# Patient Record
Sex: Male | Born: 1990 | Race: White | Hispanic: No | Marital: Single | Smoking: Current every day smoker
Health system: Southern US, Community
[De-identification: ages and names within clinical notes are randomized; demographics above are authoritative.]

## PROBLEM LIST (undated history)

## (undated) DIAGNOSIS — F1910 Other psychoactive substance abuse, uncomplicated: Secondary | ICD-10-CM

## (undated) HISTORY — PX: FRACTURE SURGERY: SHX138

---
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
## 2009-04-10 ENCOUNTER — Emergency Department: Payer: Self-pay | Admitting: Emergency Medicine

## 2009-04-20 ENCOUNTER — Emergency Department: Payer: Self-pay | Admitting: Emergency Medicine

## 2009-05-04 ENCOUNTER — Ambulatory Visit: Payer: Self-pay | Admitting: Family Medicine

## 2009-06-22 ENCOUNTER — Emergency Department: Payer: Self-pay | Admitting: Emergency Medicine

## 2009-09-16 ENCOUNTER — Emergency Department: Payer: Self-pay | Admitting: Emergency Medicine

## 2009-10-13 ENCOUNTER — Emergency Department: Payer: Self-pay | Admitting: Emergency Medicine

## 2009-10-25 ENCOUNTER — Emergency Department: Payer: Self-pay | Admitting: Emergency Medicine

## 2009-11-01 ENCOUNTER — Emergency Department: Payer: Self-pay | Admitting: Emergency Medicine

## 2009-12-11 ENCOUNTER — Ambulatory Visit: Payer: Self-pay | Admitting: Family Medicine

## 2010-04-12 ENCOUNTER — Emergency Department: Payer: Self-pay | Admitting: Emergency Medicine

## 2010-08-19 ENCOUNTER — Emergency Department: Payer: Self-pay | Admitting: Emergency Medicine

## 2010-09-06 ENCOUNTER — Emergency Department: Payer: Self-pay | Admitting: Emergency Medicine

## 2010-10-17 ENCOUNTER — Emergency Department: Payer: Self-pay | Admitting: Internal Medicine

## 2010-11-15 ENCOUNTER — Emergency Department: Payer: Self-pay | Admitting: Emergency Medicine

## 2010-11-24 ENCOUNTER — Emergency Department: Payer: Self-pay | Admitting: Emergency Medicine

## 2011-06-08 ENCOUNTER — Emergency Department: Payer: Self-pay | Admitting: *Deleted

## 2012-12-17 IMAGING — CR RIGHT TIBIA AND FIBULA - 2 VIEW
1 series · 2 of 2 positions shown · non-contrast
Comparison: none

REASON FOR EXAM: leg pain
COMMENTS:   May transport without cardiac monitor

RESULT:     Comparison is made to the previous examination dated 12/11/2009.
An intramedullary rod is present in the tibia with a single screw traversing
the distal tibia and the rod. Healed fractures are present in the midshaft
regions of the tibia and fibula.

[Series 1: view not recorded · 0.17mm/px · 2 of 2 slices shown]
[im 1/2]
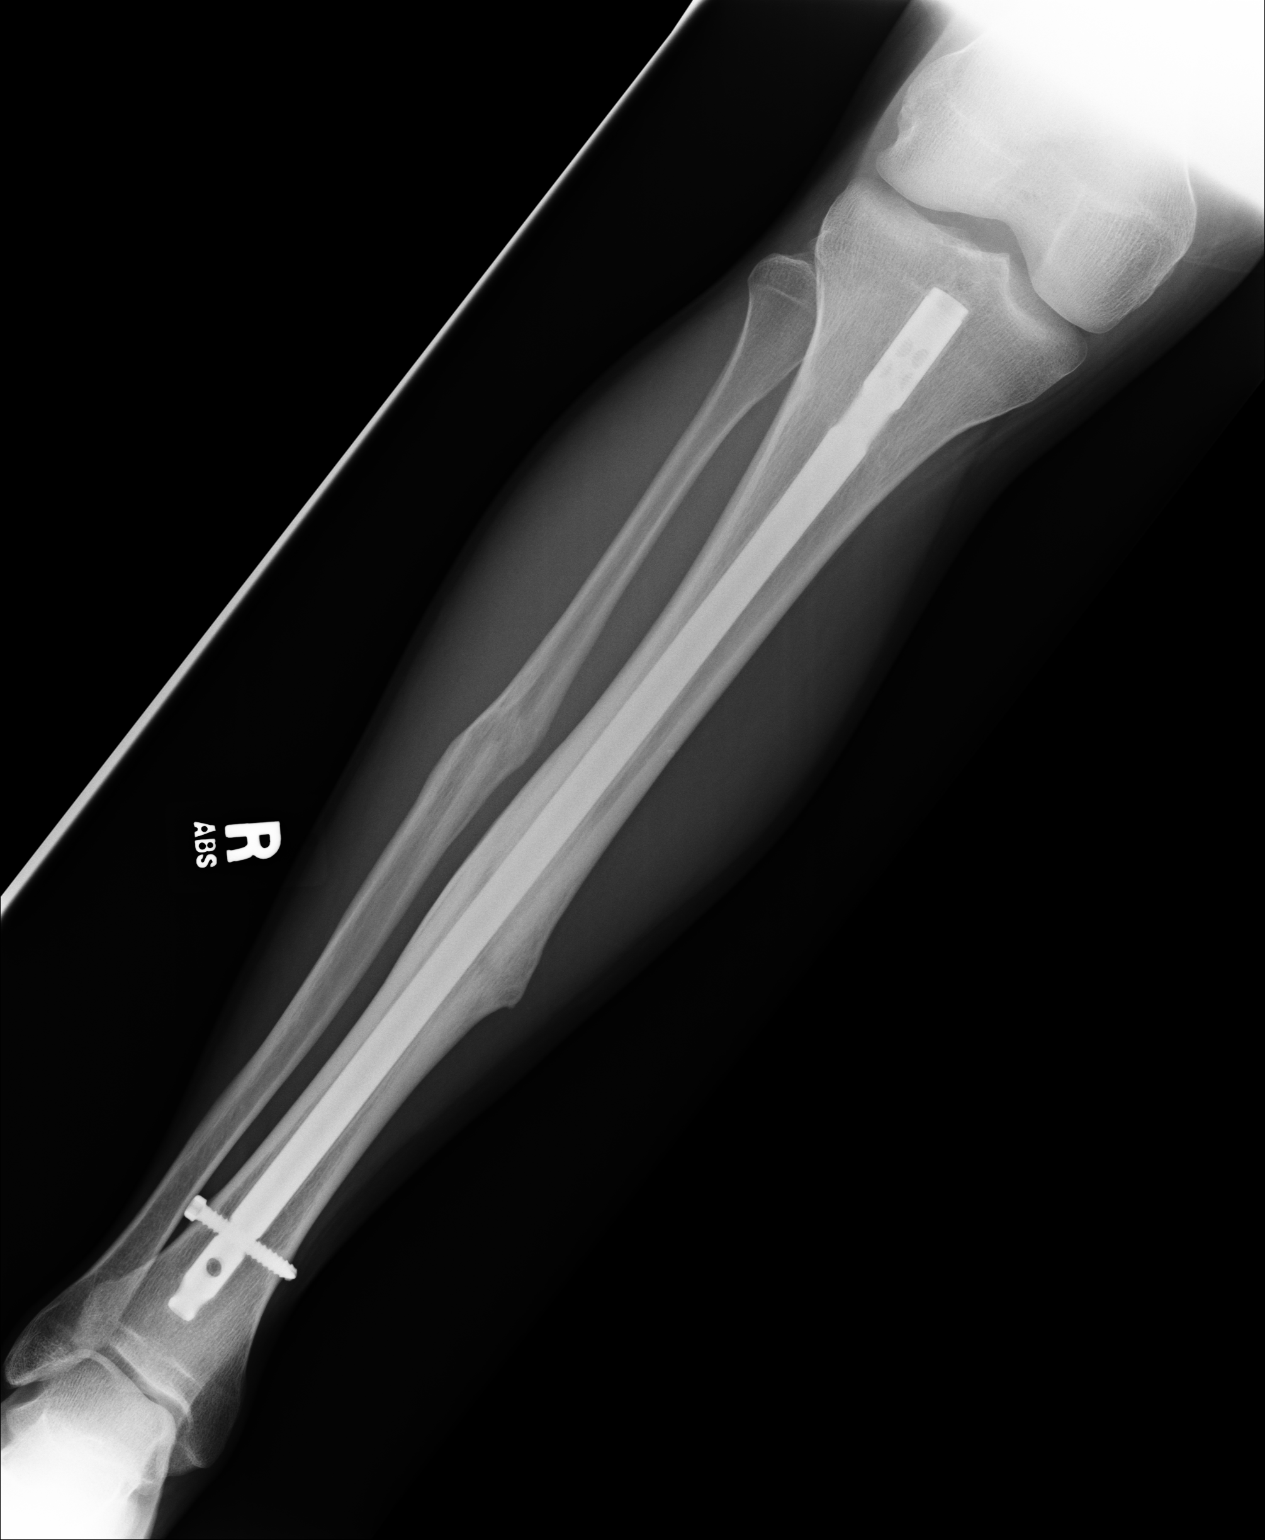
[im 2/2]
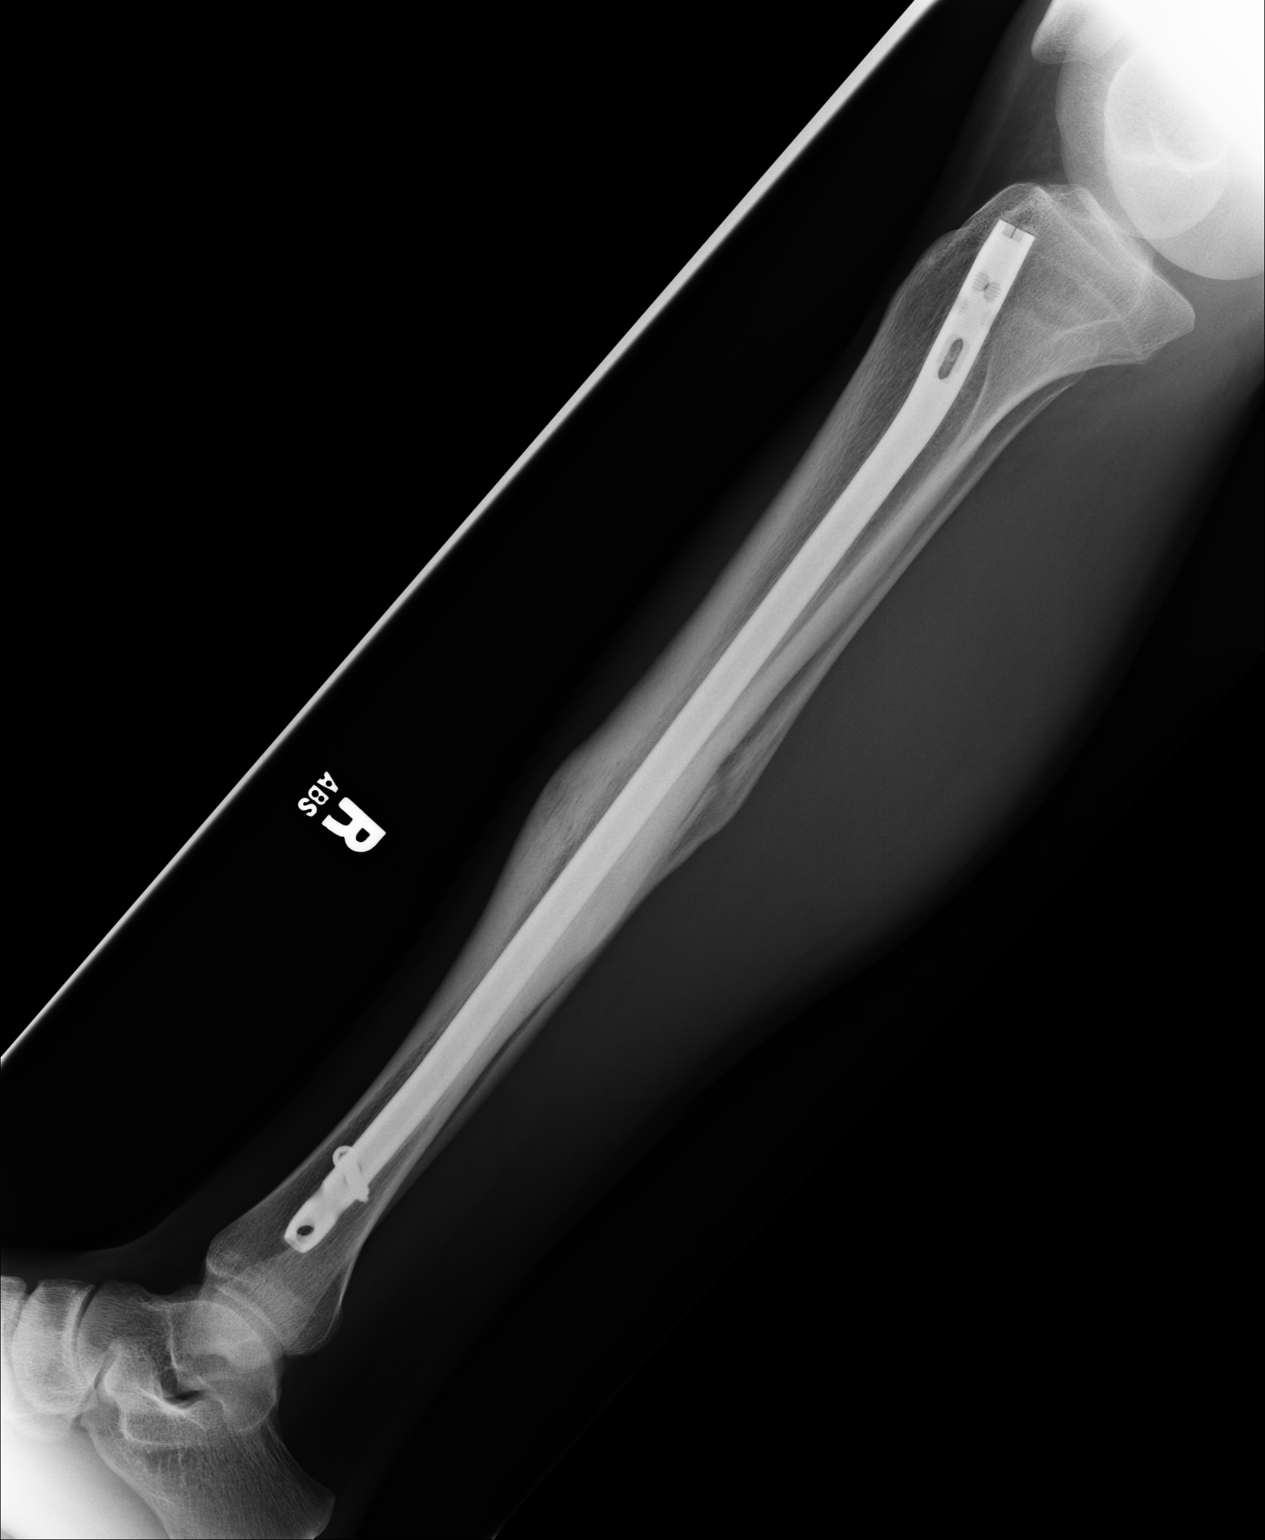

[2 of 2 positions shown; findings below may reference images not displayed]

IMPRESSION: Please see above.

## 2013-01-27 IMAGING — CR DG CHEST 2V
1 series · 2 of 2 positions shown · non-contrast
Comparison: none

REASON FOR EXAM: rib pain
COMMENTS:

[Series 1: view not recorded · 0.17mm/px · 2 of 2 slices shown]
[im 1/2]
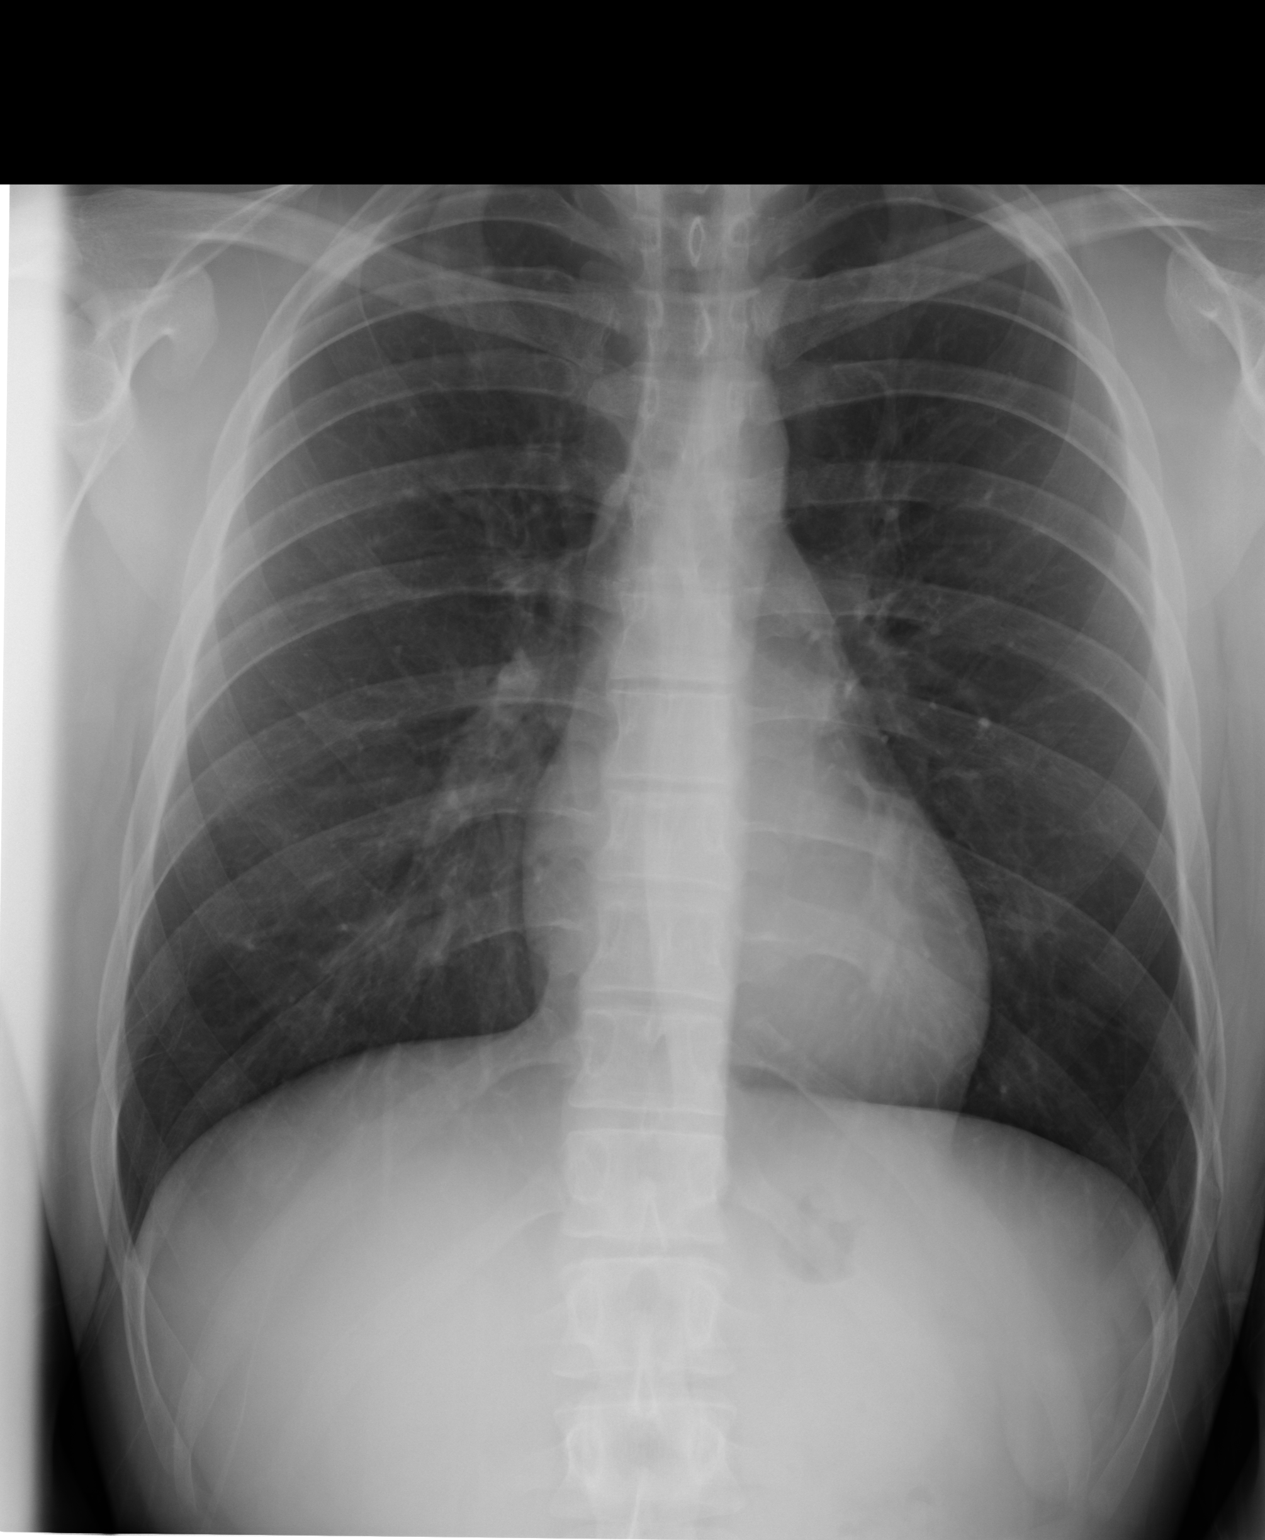
[im 2/2]
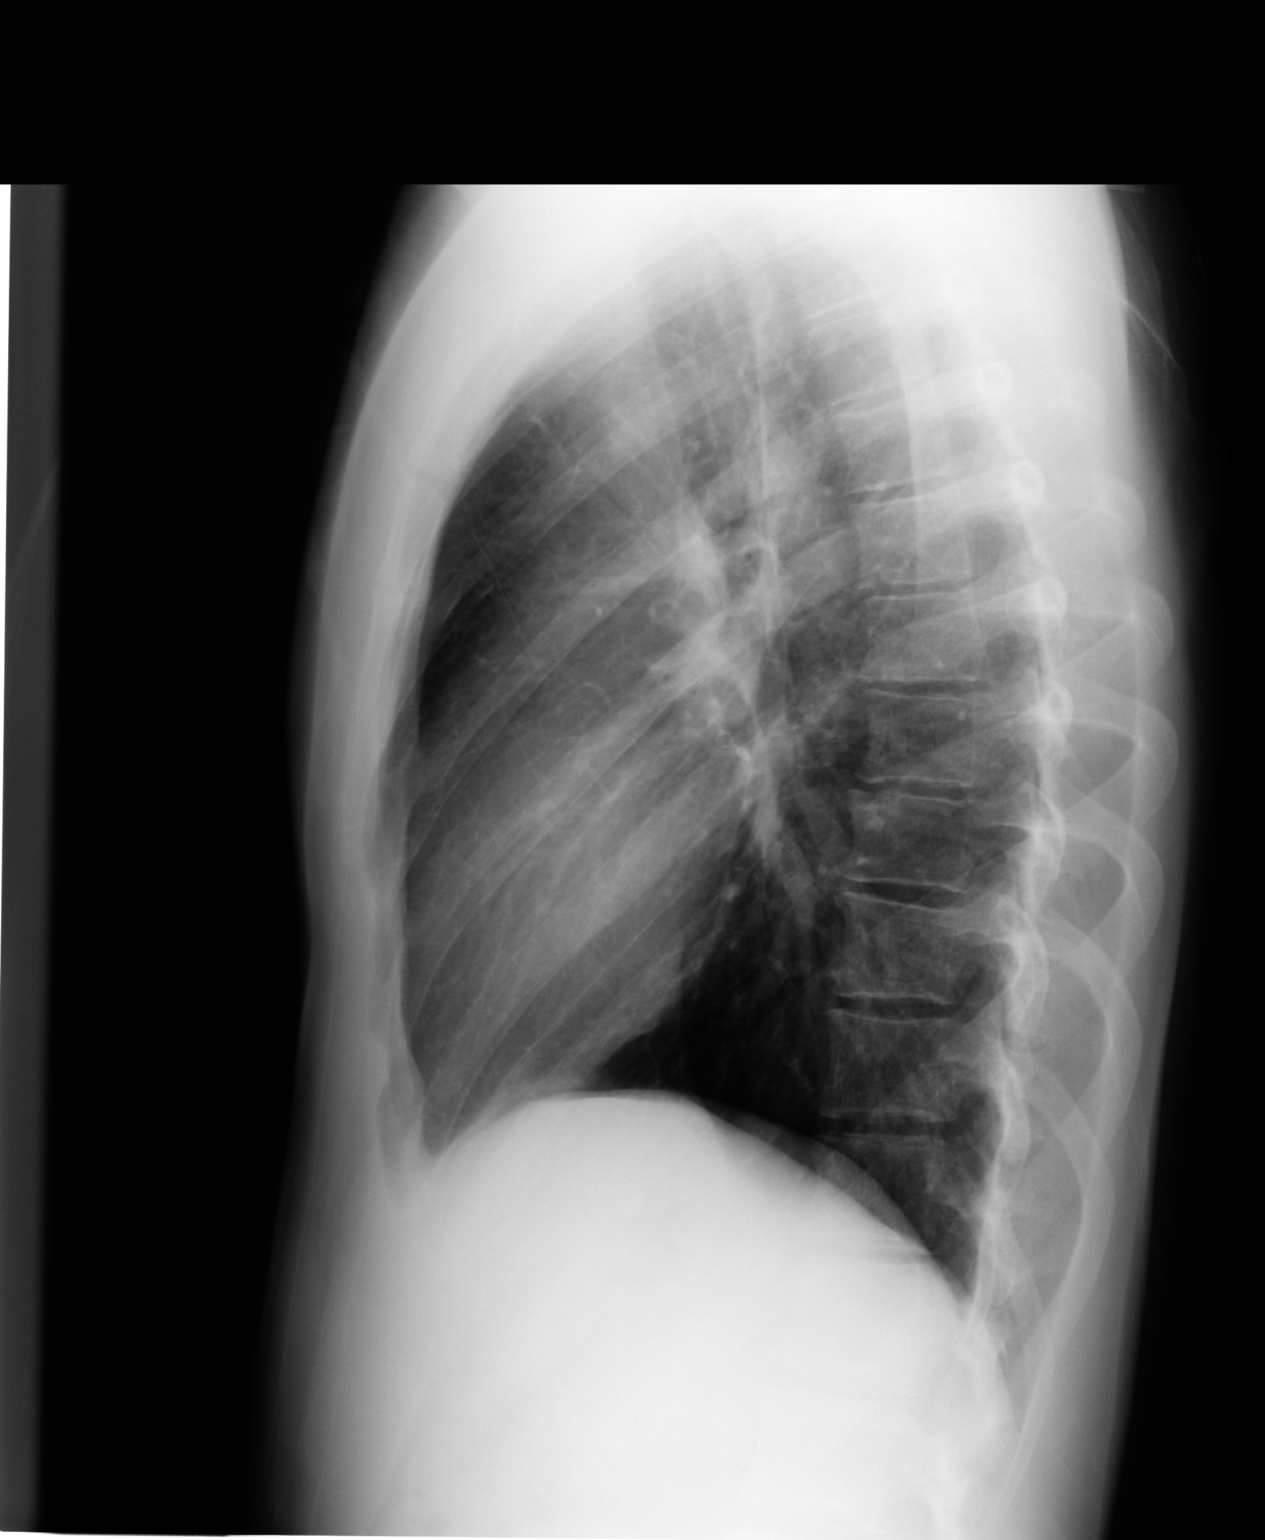

[2 of 2 positions shown; findings below may reference images not displayed]

PROCEDURE:     DXR - DXR CHEST PA (OR AP) AND LATERAL  - October 17, 2010  [DATE]

RESULT:     Comparison is made to the study of 04/12/2010.

The lungs are clear. The heart and pulmonary vessels are normal. The bony
and mediastinal structures are unremarkable. There is no effusion. There is
no pneumothorax or evidence of congestive failure.
IMPRESSION: No acute cardiopulmonary disease.

## 2014-08-07 ENCOUNTER — Ambulatory Visit: Payer: Self-pay | Admitting: Internal Medicine

## 2014-08-29 ENCOUNTER — Emergency Department: Payer: Self-pay | Admitting: Emergency Medicine

## 2018-10-02 ENCOUNTER — Emergency Department
Admission: EM | Admit: 2018-10-02 | Discharge: 2018-10-02 | Disposition: A | Discharge status: Home / Self Care | Payer: Self-pay | Attending: Emergency Medicine | Admitting: Emergency Medicine

## 2018-10-02 ENCOUNTER — Emergency Department: Payer: Self-pay

## 2018-10-02 ENCOUNTER — Encounter: Payer: Self-pay | Admitting: Emergency Medicine

## 2018-10-02 ENCOUNTER — Other Ambulatory Visit: Payer: Self-pay

## 2018-10-02 DIAGNOSIS — M0230 Reiter's disease, unspecified site: Secondary | ICD-10-CM | POA: Insufficient documentation

## 2018-10-02 DIAGNOSIS — F17210 Nicotine dependence, cigarettes, uncomplicated: Secondary | ICD-10-CM | POA: Insufficient documentation

## 2018-10-02 LAB — CBC WITH DIFFERENTIAL/PLATELET
Abs Immature Granulocytes: 0.01 10*3/uL (ref 0.00–0.07)
Basophils Absolute: 0 10*3/uL (ref 0.0–0.1)
Basophils Relative: 0 %
EOS ABS: 0.4 10*3/uL (ref 0.0–0.5)
EOS PCT: 8 %
HCT: 41.5 % (ref 39.0–52.0)
HEMOGLOBIN: 14.2 g/dL (ref 13.0–17.0)
IMMATURE GRANULOCYTES: 0 %
LYMPHS PCT: 37 %
Lymphs Abs: 2 10*3/uL (ref 0.7–4.0)
MCH: 30.9 pg (ref 26.0–34.0)
MCHC: 34.2 g/dL (ref 30.0–36.0)
MCV: 90.2 fL (ref 80.0–100.0)
Monocytes Absolute: 0.5 10*3/uL (ref 0.1–1.0)
Monocytes Relative: 10 %
NEUTROS PCT: 45 %
Neutro Abs: 2.4 10*3/uL (ref 1.7–7.7)
Platelets: 359 10*3/uL (ref 150–400)
RBC: 4.6 MIL/uL (ref 4.22–5.81)
RDW: 11.9 % (ref 11.5–15.5)
WBC: 5.4 10*3/uL (ref 4.0–10.5)
nRBC: 0 % (ref 0.0–0.2)

## 2018-10-02 LAB — COMPREHENSIVE METABOLIC PANEL
ALT: 64 U/L — AB (ref 0–44)
ANION GAP: 6 (ref 5–15)
AST: 52 U/L — ABNORMAL HIGH (ref 15–41)
Albumin: 3.9 g/dL (ref 3.5–5.0)
Alkaline Phosphatase: 54 U/L (ref 38–126)
BUN: 18 mg/dL (ref 6–20)
CHLORIDE: 105 mmol/L (ref 98–111)
CO2: 30 mmol/L (ref 22–32)
CREATININE: 0.75 mg/dL (ref 0.61–1.24)
Calcium: 8.3 mg/dL — ABNORMAL LOW (ref 8.9–10.3)
Glucose, Bld: 141 mg/dL — ABNORMAL HIGH (ref 70–99)
POTASSIUM: 4.3 mmol/L (ref 3.5–5.1)
SODIUM: 141 mmol/L (ref 135–145)
Total Bilirubin: 1.1 mg/dL (ref 0.3–1.2)
Total Protein: 6.3 g/dL — ABNORMAL LOW (ref 6.5–8.1)

## 2018-10-02 LAB — SEDIMENTATION RATE: SED RATE: 3 mm/h (ref 0–15)

## 2018-10-02 MED ORDER — PREDNISONE 20 MG PO TABS: 60.0000 mg | ORAL_TABLET | Freq: Every day | ORAL | 0 refills | Status: DC

## 2018-10-02 NOTE — ED Notes (Signed)
Pt alert and oriented X4, active, cooperative, pt in NAD. RR even and unlabored, color WNL.  Pt informed to return if any life threatening symptoms occur.  Discharge and followup instructions reviewed. Ambulates safely. 

## 2018-10-02 NOTE — ED Provider Notes (Addendum)
[facility] Emergency Department Provider Note  ____________________________________________   I have reviewed the triage vital signs and the nursing notes. Where available I have reviewed prior notes and, if possible and indicated, outside hospital notes.    HISTORY  Chief Complaint hand/foot swelling and Insect Bite    HPI Eric Clarke is a 28 y.o. male is healthy, he states since at least October has been having swelling to bilateral hands and bilateral feet, it is worse in the morning, gradually seems to get better during the course of the day.  No trauma.  No numbness no weakness.  He has had no fevers no chills.  He has no sense of fullness to his head, no history of trauma, he states that he sometimes tries nonsteroidal pain medications with limited success.  He denies any history of IV drug abuse, no recreational drugs, he does drink beer but not very often, he states a few beers on the weekend.  He denies any chest pain or shortness of breath.  He has decided to finally seek care because it is getting worse and it is involving his feet as well.  His feet have been involved for about a month.  He has no other symptoms he has no chest pain or shortness of breath.  His mother does suffer from lupus.  Also noticing a small area about 4 mm on his medial left thigh for couple days it seems to be getting better.  No other rash.  Tick bite, no IVDA history.   History reviewed. No pertinent past medical history.  There are no active problems to display for this patient.   History reviewed. No pertinent surgical history.  Prior to Admission medications   Not on File    Allergies Doxycycline  History reviewed. No pertinent family history.  Social History Social History   Tobacco Use  . Smoking status: Current Every Day Smoker    Packs/day: 0.25    Types: Cigarettes  . Smokeless tobacco: Current User    Types: Chew  Substance Use Topics  .  Alcohol use: Never    Frequency: Never  . Drug use: Never    Review of Systems Constitutional: No fever/chills Eyes: No visual changes. ENT: No sore throat. No stiff neck no neck pain Cardiovascular: Denies chest pain. Respiratory: Denies shortness of breath. Gastrointestinal:   no vomiting.  No diarrhea.  No constipation. Genitourinary: Negative for dysuria. Musculoskeletal: The HPI regarding r extremity swelling Skin: Negative for rash. Neurological: Negative for severe headaches, focal weakness or numbness.   ____________________________________________   PHYSICAL EXAM:  VITAL SIGNS: ED Triage Vitals  Enc Vitals Group     BP 10/02/18 1105 (!) 143/75     Pulse Rate 10/02/18 1105 72     Resp --      Temp 10/02/18 1105 98 F (36.7 C)     Temp Source 10/02/18 1105 Oral     SpO2 10/02/18 1105 99 %     Weight 10/02/18 1105 160 lb (72.6 kg)     Height 10/02/18 1105  (1.702 m)     Head Circumference --      Peak Flow --      Pain Score 10/02/18 1118 6     Pain Loc --      Pain Edu? --      Excl. in [place]? --     Constitutional: Alert and oriented. Well appearing and in no acute distress. Eyes: Conjunctivae are normal Head:  Atraumatic HEENT: No congestion/rhinnorhea. Mucous membranes are moist.  Oropharynx non-erythematous Neck:   Nontender with no meningismus, no masses, no stridor Cardiovascular: Normal rate, regular rhythm. Grossly normal heart sounds.  Good peripheral circulation. Respiratory: Normal respiratory effort.  No retractions. Lungs CTAB. Abdominal: Soft and nontender. No distention. No guarding no rebound Back:  There is no focal tenderness or step off.  there is no midline tenderness there are no lesions noted. there is no CVA tenderness Musculoskeletal: No lower extremity tenderness, no upper extremity tenderness.  Lateral hands do appear to be somewhat swollen, symmetrically so, very strong pulses compartments are soft, sensation and neurologic  function appears to be intact, strength intact, there is no swelling noted to the feet at this time.  No DVT signs strong distal pulses no edema Neurologic:  Normal speech and language. No gross focal neurologic deficits are appreciated.  Skin:  Skin is warm, dry and intact.  Is a small area to the left medial thigh, approximately 3 mm, indurated, not fluctuant, there is a small scab on top it looks like a resolving folliculitis with no surrounding cellulitis or foreign body noted. Psychiatric: Mood and affect are normal. Speech and behavior are normal.  ____________________________________________   LABS (all labs ordered are listed, but only abnormal results are displayed)  Labs Reviewed  COMPREHENSIVE METABOLIC PANEL - Abnormal; Notable for the following components:      Result Value   Glucose, Bld 141 (*)    Calcium 8.3 (*)    Total Protein 6.3 (*)    AST 52 (*)    ALT 64 (*)    All other components within normal limits  CBC WITH DIFFERENTIAL/PLATELET  SEDIMENTATION RATE  RHEUMATOID FACTOR  ANTINUCLEAR ANTIBODIES, IFA    Pertinent labs  results that were available during my care of the patient were reviewed by me and considered in my medical decision making (see chart for details). ____________________________________________  EKG  I personally interpreted any EKGs ordered by me or triage  ____________________________________________  RADIOLOGY  Pertinent labs & imaging results that were available during my care of the patient were reviewed by me and considered in my medical decision making (see chart for details). If possible, patient and/or family made aware of any abnormal findings.  No results found. ____________________________________________    PROCEDURES  Procedure(s) performed: None  Procedures  Critical Care performed: None  ____________________________________________   INITIAL IMPRESSION / ASSESSMENT AND PLAN / ED COURSE  Pertinent labs & imaging  results that were available during my care of the patient were reviewed by me and considered in my medical decision making (see chart for details).  Patient here with hand swelling, for nearly 6 months, as well as some occasional feet swelling.  He likely is suffering from some sort of reactive arthritic process is nothing to suggest centrally mediated vascular issue, such as IVC or SVC syndrome.  We will get a chest x-ray as a precaution but there is no evidence of CHF on exam.  I did talk to rheumatology.  He suggested a short course of steroids might help him, Dr. Gavin Potters also asked me to send a sed rate, Rh factor and ANA, which she, Dr. Gavin Potters, will follow up on as an outpatient.  He does understand that I personally would not follow-up on them.  We are doing this to see if we can advance the patient's care.  Given that is been going on for so long, I do not think there is any other  need for acute ER intervention, and I think we have stressed to the patient sufficiently the need to follow closely with rheumatology for this.  Patient is in no acute distress and I think is safe for continued outpatient care in this regard    ____________________________________________   FINAL CLINICAL IMPRESSION(S) / ED DIAGNOSES  Final diagnoses:  None      This chart was dictated using voice recognition software.  Despite best efforts to proofread,  errors can occur which can change meaning.      Jeanmarie Plant, MD 10/02/18 1305    Jeanmarie Plant, MD 10/02/18 1314

## 2018-10-02 NOTE — ED Notes (Signed)
ED Provider at bedside. 

## 2018-10-02 NOTE — ED Notes (Signed)
Bilateral hands and feet swelling X 1 month. Painful and tender to touch.

## 2018-10-02 NOTE — Discharge Instructions (Addendum)
You have swelling to her hands with suspect it is something that is best handled by rheumatologist.  We have therefore talked to the rheumatologist listed above.  He is happy to see you, and we did order blood work that will not come back while you are here but he will follow-up on.  Please call his office for an appointment he is expecting you.  In the meantime, try taking the steroids for a few days to see if they help.

## 2018-10-02 NOTE — ED Triage Notes (Signed)
Pt presents to ED c/o swelling to bil hands and bil feet x4 days. Pt states swelling is worse upon waking and gradually decreases over the day. Pt also c/o abscess/spider bite to inner L thigh.

## 2018-10-03 LAB — ANTINUCLEAR ANTIBODIES, IFA: ANA Ab, IFA: NEGATIVE

## 2018-10-03 LAB — RHEUMATOID FACTOR: Rheumatoid fact SerPl-aCnc: 11.2 IU/mL (ref 0.0–13.9)

## 2019-02-16 ENCOUNTER — Emergency Department: Payer: Self-pay

## 2019-02-16 ENCOUNTER — Emergency Department
Admission: EM | Admit: 2019-02-16 | Discharge: 2019-02-16 | Disposition: A | Discharge status: Home / Self Care | Payer: Self-pay | Attending: Emergency Medicine | Admitting: Emergency Medicine

## 2019-02-16 ENCOUNTER — Other Ambulatory Visit: Payer: Self-pay

## 2019-02-16 ENCOUNTER — Encounter: Payer: Self-pay | Admitting: Emergency Medicine

## 2019-02-16 DIAGNOSIS — T401X1A Poisoning by heroin, accidental (unintentional), initial encounter: Secondary | ICD-10-CM | POA: Insufficient documentation

## 2019-02-16 DIAGNOSIS — F329 Major depressive disorder, single episode, unspecified: Secondary | ICD-10-CM | POA: Insufficient documentation

## 2019-02-16 DIAGNOSIS — F17210 Nicotine dependence, cigarettes, uncomplicated: Secondary | ICD-10-CM | POA: Insufficient documentation

## 2019-02-16 DIAGNOSIS — Z046 Encounter for general psychiatric examination, requested by authority: Secondary | ICD-10-CM | POA: Insufficient documentation

## 2019-02-16 DIAGNOSIS — R0789 Other chest pain: Secondary | ICD-10-CM | POA: Insufficient documentation

## 2019-02-16 LAB — CBC WITH DIFFERENTIAL/PLATELET
Abs Immature Granulocytes: 0.08 10*3/uL — ABNORMAL HIGH (ref 0.00–0.07)
Basophils Absolute: 0 10*3/uL (ref 0.0–0.1)
Basophils Relative: 0 %
Eosinophils Absolute: 0.2 10*3/uL (ref 0.0–0.5)
Eosinophils Relative: 2 %
HCT: 44 % (ref 39.0–52.0)
Hemoglobin: 15.2 g/dL (ref 13.0–17.0)
Immature Granulocytes: 1 %
Lymphocytes Relative: 19 %
Lymphs Abs: 2.1 10*3/uL (ref 0.7–4.0)
MCH: 31.7 pg (ref 26.0–34.0)
MCHC: 34.5 g/dL (ref 30.0–36.0)
MCV: 91.9 fL (ref 80.0–100.0)
Monocytes Absolute: 1 10*3/uL (ref 0.1–1.0)
Monocytes Relative: 9 %
Neutro Abs: 7.7 10*3/uL (ref 1.7–7.7)
Neutrophils Relative %: 69 %
Platelets: 351 10*3/uL (ref 150–400)
RBC: 4.79 MIL/uL (ref 4.22–5.81)
RDW: 11.7 % (ref 11.5–15.5)
WBC: 11.1 10*3/uL — ABNORMAL HIGH (ref 4.0–10.5)
nRBC: 0 % (ref 0.0–0.2)

## 2019-02-16 LAB — COMPREHENSIVE METABOLIC PANEL
ALT: 44 U/L (ref 0–44)
AST: 35 U/L (ref 15–41)
Albumin: 4.3 g/dL (ref 3.5–5.0)
Alkaline Phosphatase: 60 U/L (ref 38–126)
Anion gap: 9 (ref 5–15)
BUN: 16 mg/dL (ref 6–20)
CO2: 27 mmol/L (ref 22–32)
Calcium: 8.5 mg/dL — ABNORMAL LOW (ref 8.9–10.3)
Chloride: 105 mmol/L (ref 98–111)
Creatinine, Ser: 0.74 mg/dL (ref 0.61–1.24)
GFR calc Af Amer: >60 mL/min (ref >60)
GFR calc non Af Amer: >60 mL/min (ref >60)
Glucose, Bld: 142 mg/dL — ABNORMAL HIGH (ref 70–99)
Potassium: 4.2 mmol/L (ref 3.5–5.1)
Sodium: 141 mmol/L (ref 135–145)
Total Bilirubin: 0.5 mg/dL (ref 0.3–1.2)
Total Protein: 6.9 g/dL (ref 6.5–8.1)

## 2019-02-16 MED ORDER — ACETAMINOPHEN 500 MG PO TABS
1000.0000 mg | ORAL_TABLET | Freq: Once | ORAL | Status: AC
  Administered 2019-02-16: 1000 mg via ORAL
  Filled 2019-02-16: qty 2

## 2019-02-16 MED ORDER — NALOXONE HCL 4 MG/0.1ML NA LIQD
1.0000 | Freq: Once | NASAL | Status: DC
  Filled 2019-02-16: qty 4

## 2019-02-16 NOTE — ED Notes (Signed)
Patient coming in under IVC for overdose, suspected SI.  Patient denies SI. Patient does report depression due to issues with significant other.   Patient c/o chest pain post CPR. Redness noted to medial chest. Patient is very tender to palpation of chest .

## 2019-02-16 NOTE — ED Provider Notes (Signed)
[facility] Emergency Department Provider Note  ____________________________________________  Time seen: Approximately 5:23 AM  I have reviewed the triage vital signs and the nursing notes.   HISTORY  Chief Complaint Psychiatric Evaluation and Drug Overdose   HPI Eric Clarke is a 28 y.o. male with a history of polysubstance abuse who presents for evaluation of an accidental overdose.  Patient reports that he has a history of using methamphetamines and cocaine in the past.  Today he injected heroin for the first time.  He was found by his friend unconscious and not breathing on the floor of the bathroom.  Friend started CPR.  When police arrived patient had a pulse and agonal respirations.  He received Narcan with return to his baseline.  Patient is very polite and apologetic, denies any suicidal intent.  Reports that the overdose was accidental.  Patient was IVC to be brought into the emergency room for evaluation.  Patient is complaining of soreness in his chest which started after he received CPR.  No shortness of breath or cough.  PMH Polysubstance abuse  Past Surgical History:  Procedure Laterality Date  . FRACTURE SURGERY      Prior to Admission medications   Not on File    Allergies Doxycycline  No family history on file.  Social History Social History   Tobacco Use  . Smoking status: Current Every Day Smoker    Packs/day: 0.25    Types: Cigarettes  . Smokeless tobacco: Current User    Types: Chew  Substance Use Topics  . Alcohol use: Yes    Frequency: Never    Comment: occ  . Drug use: Yes    Types: Methamphetamines    Comment: herion    Review of Systems  Constitutional: Negative for fever. Eyes: Negative for visual changes. ENT: Negative for sore throat. Neck: No neck pain  Cardiovascular: Negative for chest pain. + chest soreness Respiratory: Negative for shortness of breath. Gastrointestinal: Negative for  abdominal pain, vomiting or diarrhea. Genitourinary: Negative for dysuria. Musculoskeletal: Negative for back pain. Skin: Negative for rash. Neurological: Negative for headaches, weakness or numbness. Psych: No SI or HI  ____________________________________________   PHYSICAL EXAM:  VITAL SIGNS: ED Triage Vitals  Enc Vitals Group     BP 02/16/19 0413 123/86     Pulse Rate 02/16/19 0414 93     Resp 02/16/19 0413 18     Temp 02/16/19 0413 97.8 F (36.6 C)     Temp Source 02/16/19 0413 Oral     SpO2 02/16/19 0413 99 %     Weight 02/16/19 0414 150 lb (68 kg)     Height 02/16/19 0414 5\' 7"  (1.702 m)     Head Circumference --      Peak Flow --      Pain Score 02/16/19 0414 9     Pain Loc --      Pain Edu? --      Excl. in [place]? --     Constitutional: Alert and oriented. Well appearing and in no apparent distress. HEENT:      Head: Normocephalic and atraumatic.         Eyes: Conjunctivae are normal. Sclera is non-icteric.       Mouth/Throat: Mucous membranes are moist.       Neck: Supple with no signs of meningismus. Cardiovascular: Regular rate and rhythm. No murmurs, gallops, or rubs. 2+ symmetrical distal pulses are present in all extremities. No JVD. Respiratory: Normal respiratory  effort. Lungs are clear to auscultation bilaterally. No wheezes, crackles, or rhonchi.  Gastrointestinal: Soft, non tender, and non distended with positive bowel sounds. No rebound or guarding. Musculoskeletal: Nontender with normal range of motion in all extremities. No edema, cyanosis, or erythema of extremities. Neurologic: Normal speech and language. Face is symmetric. Moving all extremities. No gross focal neurologic deficits are appreciated. Skin: Skin is warm, dry and intact. No rash noted. Psychiatric: Mood and affect are normal. Speech and behavior are normal.  ____________________________________________   LABS (all labs ordered are listed, but only abnormal results are displayed)   Labs Reviewed  CBC WITH DIFFERENTIAL/PLATELET - Abnormal; Notable for the following components:      Result Value   WBC 11.1 (*)    Abs Immature Granulocytes 0.08 (*)    All other components within normal limits  COMPREHENSIVE METABOLIC PANEL - Abnormal; Notable for the following components:   Glucose, Bld 142 (*)    Calcium 8.5 (*)    All other components within normal limits   ____________________________________________  EKG  ED ECG REPORT I, Nita Sicklearolina Yohanna Tow, the attending physician, personally viewed and interpreted this ECG.  Normal sinus rhythm, rate of 69, normal intervals, normal axis, no ST elevations or depressions.  Normal EKG ____________________________________________  RADIOLOGY  I have personally reviewed the images performed during this visit and I agree with the Radiologist's read.   Interpretation by Radiologist:  Dg Chest Portable 1 View  Result Date: 02/16/2019 CLINICAL DATA:  Heroin overdose. EXAM: PORTABLE CHEST 1 VIEW COMPARISON:  Chest x-ray stated 10/02/2018 and 08/07/2014. FINDINGS: Heart size and mediastinal contours are within normal limits. Lungs are clear. No pleural effusion or pneumothorax seen. Osseous structures about the chest are unremarkable. IMPRESSION: No active disease. Electronically Signed   By: Bary RichardStan  Maynard M.D.   On: 02/16/2019 04:59     ____________________________________________   PROCEDURES  Procedure(s) performed: None Procedures Critical Care performed:  None ____________________________________________   INITIAL IMPRESSION / ASSESSMENT AND PLAN / ED COURSE  28 y.o. male with a history of polysubstance abuse who presents for evaluation of an accidental overdose on heroin.  Patient IVC by police however patient denies any suicidal intent.  Patient reports unintentional overdose.  Will monitor patient for a couple of hour for possible need of re-dosing Narcan.  At this time patient is GCS of 15, alert and oriented x3,  with normal breathing and normal vitals.  Patient complaining of chest soreness after CPR.  Chest x-ray with no evidence of pneumothorax or broken ribs.  EKG nonischemic.  Labs with no significant abnormalities.      _________________________ 6:50 AM on 02/16/2019 -----------------------------------------  Patient monitored for 3 hours post Narcan with no recurrence of his symptoms.  Will discharge home.  Patient provided with intranasal Narcan rescue.  Counseling was provided on drug cessation. Labs, EKG and chest x-ray with no acute findings.   As part of my medical decision making, I reviewed the following data within the electronic MEDICAL RECORD NUMBER Nursing notes reviewed and incorporated, Labs reviewed , EKG interpreted , Old chart reviewed, Radiograph reviewed , Notes from prior ED visits and [place] Controlled Substance Database    Pertinent labs & imaging results that were available during my care of the patient were reviewed by me and considered in my medical decision making (see chart for details).    ____________________________________________   FINAL CLINICAL IMPRESSION(S) / ED DIAGNOSES  Final diagnoses:  Overdose of heroin, accidental or unintentional, initial encounter (HCC)  NEW MEDICATIONS STARTED DURING THIS VISIT:  ED Discharge Orders    None       Note:  This document was prepared using Dragon voice recognition software and may include unintentional dictation errors.    Don PerkingVeronese, [place], MD 02/16/19 [phone]

## 2019-02-16 NOTE — ED Triage Notes (Signed)
Patient brought in by sheriff for IVC for heroin overdose. Per officer patient's friend initiated CPR and patient was given 4 mg of narcan intranasal at 02:56. Patient has been alert since that time.

## 2019-04-26 ENCOUNTER — Other Ambulatory Visit: Payer: Self-pay

## 2019-04-26 ENCOUNTER — Encounter: Payer: Self-pay | Admitting: Emergency Medicine

## 2019-04-26 ENCOUNTER — Emergency Department
Admission: EM | Admit: 2019-04-26 | Discharge: 2019-04-26 | Disposition: A | Discharge status: Home / Self Care | Payer: Self-pay | Attending: Emergency Medicine | Admitting: Emergency Medicine

## 2019-04-26 DIAGNOSIS — F17210 Nicotine dependence, cigarettes, uncomplicated: Secondary | ICD-10-CM | POA: Insufficient documentation

## 2019-04-26 DIAGNOSIS — F1510 Other stimulant abuse, uncomplicated: Secondary | ICD-10-CM | POA: Insufficient documentation

## 2019-04-26 DIAGNOSIS — M791 Myalgia, unspecified site: Secondary | ICD-10-CM | POA: Insufficient documentation

## 2019-04-26 LAB — COMPREHENSIVE METABOLIC PANEL
ALT: 54 U/L — ABNORMAL HIGH (ref 0–44)
AST: 35 U/L (ref 15–41)
Albumin: 4 g/dL (ref 3.5–5.0)
Alkaline Phosphatase: 75 U/L (ref 38–126)
Anion gap: 15 (ref 5–15)
BUN: 9 mg/dL (ref 6–20)
CO2: 20 mmol/L — ABNORMAL LOW (ref 22–32)
Calcium: 9.1 mg/dL (ref 8.9–10.3)
Chloride: 102 mmol/L (ref 98–111)
Creatinine, Ser: 0.8 mg/dL (ref 0.61–1.24)
GFR calc Af Amer: >60 mL/min (ref >60)
GFR calc non Af Amer: >60 mL/min (ref >60)
Glucose, Bld: 104 mg/dL — ABNORMAL HIGH (ref 70–99)
Potassium: 3.6 mmol/L (ref 3.5–5.1)
Sodium: 137 mmol/L (ref 135–145)
Total Bilirubin: 1.4 mg/dL — ABNORMAL HIGH (ref 0.3–1.2)
Total Protein: 7.2 g/dL (ref 6.5–8.1)

## 2019-04-26 LAB — SALICYLATE LEVEL: Salicylate Lvl: <7 mg/dL (ref 2.8–30.0)

## 2019-04-26 LAB — CBC WITH DIFFERENTIAL/PLATELET
Abs Immature Granulocytes: 0.07 10*3/uL (ref 0.00–0.07)
Basophils Absolute: 0 10*3/uL (ref 0.0–0.1)
Basophils Relative: 0 %
Eosinophils Absolute: 0 10*3/uL (ref 0.0–0.5)
Eosinophils Relative: 0 %
HCT: 42.6 % (ref 39.0–52.0)
Hemoglobin: 15 g/dL (ref 13.0–17.0)
Immature Granulocytes: 0 %
Lymphocytes Relative: 10 %
Lymphs Abs: 2 10*3/uL (ref 0.7–4.0)
MCH: 31.8 pg (ref 26.0–34.0)
MCHC: 35.2 g/dL (ref 30.0–36.0)
MCV: 90.4 fL (ref 80.0–100.0)
Monocytes Absolute: 1.1 10*3/uL — ABNORMAL HIGH (ref 0.1–1.0)
Monocytes Relative: 6 %
Neutro Abs: 15.7 10*3/uL — ABNORMAL HIGH (ref 1.7–7.7)
Neutrophils Relative %: 84 %
Platelets: 421 10*3/uL — ABNORMAL HIGH (ref 150–400)
RBC: 4.71 MIL/uL (ref 4.22–5.81)
RDW: 11.5 % (ref 11.5–15.5)
WBC: 18.9 10*3/uL — ABNORMAL HIGH (ref 4.0–10.5)
nRBC: 0 % (ref 0.0–0.2)

## 2019-04-26 LAB — ACETAMINOPHEN LEVEL: Acetaminophen (Tylenol), Serum: <10 ug/mL — ABNORMAL LOW (ref 10–30)

## 2019-04-26 LAB — ETHANOL: Alcohol, Ethyl (B): <10 mg/dL (ref <10)

## 2019-04-26 LAB — CK: Total CK: 149 U/L (ref 49–397)

## 2019-04-26 MED ORDER — LORAZEPAM 2 MG/ML IJ SOLN
1.0000 mg | Freq: Once | INTRAMUSCULAR | Status: AC
  Administered 2019-04-26: 09:00:00 1 mg via INTRAVENOUS

## 2019-04-26 MED ORDER — KETOROLAC TROMETHAMINE 30 MG/ML IJ SOLN
30.0000 mg | Freq: Once | INTRAMUSCULAR | Status: AC
  Administered 2019-04-26: 09:00:00 30 mg via INTRAVENOUS
  Filled 2019-04-26: qty 1

## 2019-04-26 MED ORDER — HALOPERIDOL LACTATE 5 MG/ML IJ SOLN
5.0000 mg | Freq: Once | INTRAMUSCULAR | Status: AC
  Administered 2019-04-26: 5 mg via INTRAVENOUS
  Filled 2019-04-26: qty 1

## 2019-04-26 MED ORDER — SODIUM CHLORIDE 0.9 % IV SOLN
Freq: Once | INTRAVENOUS | Status: AC
  Administered 2019-04-26: 09:00:00 via INTRAVENOUS

## 2019-04-26 MED ORDER — LORAZEPAM 2 MG/ML IJ SOLN
1.0000 mg | Freq: Once | INTRAMUSCULAR | Status: AC
  Administered 2019-04-26: 09:00:00 1 mg via INTRAVENOUS
  Filled 2019-04-26: qty 1

## 2019-04-26 NOTE — ED Notes (Signed)
Dr Williams at bedside 

## 2019-04-26 NOTE — ED Notes (Signed)
Pt is sleeping at this time.

## 2019-04-26 NOTE — ED Triage Notes (Signed)
Pt arrival from home via EMS due to muscle cramps that started around 2300 last night.   Pt states using meth yesterday evening around 2230. Pt states hx of muscle cramps like this when he was 62 and 19.

## 2019-04-26 NOTE — ED Provider Notes (Signed)
[facility] Emergency Department Provider Note       Time seen: ----------------------------------------- 8:30 AM on 04/26/2019 -----------------------------------------   I have reviewed the triage vital signs and the nursing notes.  HISTORY   Chief Complaint Drug Problem   HPI Eric Clarke is a 28 y.o. male with no known past medical history who presents to the ED for severe muscle cramps all over.  Patient states he injected methamphetamines last night.  He has been writhing in pain with muscle cramps and spasms all night.  He finally called EMS.  He denies any other drug use.  No past medical history on file.  There are no active problems to display for this patient.   Past Surgical History:  Procedure Laterality Date  . FRACTURE SURGERY      Allergies Doxycycline  Social History Social History   Tobacco Use  . Smoking status: Current Every Day Smoker    Packs/day: 0.25    Types: Cigarettes  . Smokeless tobacco: Current User    Types: Chew  Substance Use Topics  . Alcohol use: Yes    Frequency: Never    Comment: occ  . Drug use: Yes    Types: Methamphetamines    Comment: herion   Review of Systems Constitutional: Negative for fever. Cardiovascular: Negative for chest pain. Respiratory: Negative for shortness of breath. Gastrointestinal: Negative for abdominal pain, vomiting and diarrhea. Musculoskeletal: Positive for muscle cramps and spasms Skin: Negative for rash. Neurological: Negative for headaches, focal weakness or numbness.  All systems negative/normal/unremarkable except as stated in the HPI  ____________________________________________   PHYSICAL EXAM:  VITAL SIGNS: ED Triage Vitals  Enc Vitals Group     BP      Pulse      Resp      Temp      Temp src      SpO2      Weight      Height      Head Circumference      Peak Flow      Pain Score      Pain Loc      Pain Edu?      Excl. in [place]?     Constitutional: Alert and oriented.  Agitated, mild distress Eyes: Conjunctivae are normal. Normal extraocular movements. ENT      Head: Normocephalic and atraumatic.      Nose: No congestion/rhinnorhea.      Mouth/Throat: Mucous membranes are moist.      Neck: No stridor. Cardiovascular: Normal rate, regular rhythm. No murmurs, rubs, or gallops. Respiratory: Normal respiratory effort without tachypnea nor retractions. Breath sounds are clear and equal bilaterally. No wheezes/rales/rhonchi. Gastrointestinal: Soft and nontender. Normal bowel sounds Musculoskeletal: Nontender with normal range of motion in extremities. No lower extremity tenderness nor edema. Neurologic:  Normal speech and language. No gross focal neurologic deficits are appreciated.  Skin:  Skin is warm, dry and intact. No rash noted. Psychiatric: Agitated and elevated mood ____________________________________________  EKG: Interpreted by me.  Sinus tachycardia with a rate of 107 bpm, normal PR interval, normal QRS, normal QT  ____________________________________________  ED COURSE:  As part of my medical decision making, I reviewed the following data within the [place] History obtained from family if available, nursing notes, old chart and ekg, as well as notes from prior ED visits. Patient presented for muscle cramps and spasms, we will assess with labs and imaging as indicated at this time.  Procedures  Eric Clarke was evaluated in Emergency Department on 04/26/2019 for the symptoms described in the history of present illness. He was evaluated in the context of the global COVID-19 pandemic, which necessitated consideration that the patient might be at risk for infection with the SARS-CoV-2 virus that causes COVID-19. Institutional protocols and algorithms that pertain to the evaluation of patients at risk for COVID-19 are in a state of rapid change based on information released by  regulatory bodies including the CDC and federal and state organizations. These policies and algorithms were followed during the patient's care in the ED.  ____________________________________________   LABS (pertinent positives/negatives)  Labs Reviewed  CBC WITH DIFFERENTIAL/PLATELET - Abnormal; Notable for the following components:      Result Value   WBC 18.9 (*)    Platelets 421 (*)    Neutro Abs 15.7 (*)    Monocytes Absolute 1.1 (*)    All other components within normal limits  COMPREHENSIVE METABOLIC PANEL - Abnormal; Notable for the following components:   CO2 20 (*)    Glucose, Bld 104 (*)    ALT 54 (*)    Total Bilirubin 1.4 (*)    All other components within normal limits  ACETAMINOPHEN LEVEL - Abnormal; Notable for the following components:   Acetaminophen (Tylenol), Serum <10 (*)    All other components within normal limits  ETHANOL  SALICYLATE LEVEL  CK  URINE DRUG SCREEN, QUALITATIVE ([facility] ONLY)   ____________________________________________   DIFFERENTIAL DIAGNOSIS   Substance abuse, muscle spasm, rhabdomyolysis, intoxication, withdrawal  FINAL ASSESSMENT AND PLAN  Myalgias, methamphetamine abuse   Plan: The patient had presented for severe muscle cramps and pain.  Patient was given IV Haldol and Ativan as well as fluids.  Patient's labs revealed leukocytosis which is likely a stress response but no evidence of rhabdomyolysis.  He rested comfortably for a period of hours and is otherwise cleared for outpatient follow-up.  He has declined detox.   Ulice Dash, MD    Note: This note was generated in part or whole with voice recognition software. Voice recognition is usually quite accurate but there are transcription errors that can and very often do occur. I apologize for any typographical errors that were not detected and corrected.     Emily Filbert, MD 04/26/19 [phone]

## 2019-04-26 NOTE — ED Notes (Signed)
Pt yelling, thrashing in bed. Pt states "This is bullshit, please help me! Y'all need to help me, this shit ain't working! [place]!"  Dr. Jimmye Norman at bedside. Pt states "I need Morphine". Pt informed by Dr. Jimmye Norman that he is not getting morphine and that he needs to stop yelling out at staff. Dr. Jimmye Norman, this RN and [place] [employer], RN had conversation with patient regarding appropriate patient behavior. Charge RN made aware of patient behavior at this time. Pt made high violence due to verbally aggressive behavior with staff.   Pt is now lying in bed, not yelling, not thrashing around, heart rate now WNL.

## 2019-05-15 ENCOUNTER — Emergency Department
Admission: EM | Admit: 2019-05-15 | Discharge: 2019-05-16 | Disposition: A | Discharge status: Home / Self Care | Payer: Self-pay | Attending: Emergency Medicine | Admitting: Emergency Medicine

## 2019-05-15 ENCOUNTER — Encounter: Payer: Self-pay | Admitting: *Deleted

## 2019-05-15 ENCOUNTER — Other Ambulatory Visit: Payer: Self-pay

## 2019-05-15 DIAGNOSIS — F1910 Other psychoactive substance abuse, uncomplicated: Secondary | ICD-10-CM

## 2019-05-15 DIAGNOSIS — F17210 Nicotine dependence, cigarettes, uncomplicated: Secondary | ICD-10-CM | POA: Insufficient documentation

## 2019-05-15 DIAGNOSIS — F1110 Opioid abuse, uncomplicated: Secondary | ICD-10-CM | POA: Insufficient documentation

## 2019-05-15 DIAGNOSIS — F1210 Cannabis abuse, uncomplicated: Secondary | ICD-10-CM | POA: Insufficient documentation

## 2019-05-15 LAB — COMPREHENSIVE METABOLIC PANEL
ALT: 44 U/L (ref 0–44)
AST: 31 U/L (ref 15–41)
Albumin: 4.1 g/dL (ref 3.5–5.0)
Alkaline Phosphatase: 72 U/L (ref 38–126)
Anion gap: 9 (ref 5–15)
BUN: 11 mg/dL (ref 6–20)
CO2: 24 mmol/L (ref 22–32)
Calcium: 8.7 mg/dL — ABNORMAL LOW (ref 8.9–10.3)
Chloride: 106 mmol/L (ref 98–111)
Creatinine, Ser: 0.7 mg/dL (ref 0.61–1.24)
GFR calc Af Amer: >60 mL/min (ref >60)
GFR calc non Af Amer: >60 mL/min (ref >60)
Glucose, Bld: 103 mg/dL — ABNORMAL HIGH (ref 70–99)
Potassium: 4.3 mmol/L (ref 3.5–5.1)
Sodium: 139 mmol/L (ref 135–145)
Total Bilirubin: 0.5 mg/dL (ref 0.3–1.2)
Total Protein: 7.3 g/dL (ref 6.5–8.1)

## 2019-05-15 LAB — CBC
HCT: 42.9 % (ref 39.0–52.0)
Hemoglobin: 14.7 g/dL (ref 13.0–17.0)
MCH: 31.5 pg (ref 26.0–34.0)
MCHC: 34.3 g/dL (ref 30.0–36.0)
MCV: 92.1 fL (ref 80.0–100.0)
Platelets: 416 10*3/uL — ABNORMAL HIGH (ref 150–400)
RBC: 4.66 MIL/uL (ref 4.22–5.81)
RDW: 11.4 % — ABNORMAL LOW (ref 11.5–15.5)
WBC: 9.1 10*3/uL (ref 4.0–10.5)
nRBC: 0 % (ref 0.0–0.2)

## 2019-05-15 LAB — SALICYLATE LEVEL: Salicylate Lvl: <7 mg/dL (ref 2.8–30.0)

## 2019-05-15 LAB — ETHANOL: Alcohol, Ethyl (B): <10 mg/dL (ref <10)

## 2019-05-15 LAB — ACETAMINOPHEN LEVEL: Acetaminophen (Tylenol), Serum: <10 ug/mL — ABNORMAL LOW (ref 10–30)

## 2019-05-15 MED ORDER — NALOXONE HCL 2 MG/2ML IJ SOSY
PREFILLED_SYRINGE | INTRAMUSCULAR | Status: AC
  Filled 2019-05-15: qty 2

## 2019-05-15 NOTE — ED Notes (Signed)
Dr Brown and Butch, RN at bedside at this time. 

## 2019-05-15 NOTE — ED Notes (Signed)
Pt sleepy and unable to hold head up.  Unable to void at this time.  Pt to room 10 via wheelchair.
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
## 2019-05-15 NOTE — ED Notes (Signed)
Pt denies CP/HA/nausea. A little lethargic-like but A&Ox4.
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
## 2019-05-16 NOTE — ED Provider Notes (Addendum)
[facility] Emergency Department Provider Note   First MD Initiated Contact with Patient 05/15/19 2331     (approximate)  I have reviewed the triage vital signs and the nursing notes.   HISTORY  Chief Complaint Drug Overdose   HPI Eric Clarke is a 28 y.o. male presents emergency department via EMS secondary to overdose.  EMS states that the patient significant other called.  Patient does admit to drinking a sixpack of beer taking 2 Percocets and smoking marijuana tonight.  Patient states that he did so secondary to the fact that he did so because "got an argument with my girlfriend and now she is trying to get high".  Patient denies any suicidal ideation.  Patient denies any homicidal ideation.  Patient admits to previous suicide attempt while he was incarcerated but states "I only did it to get out of the hole".       No past medical history on file.  There are no active problems to display for this patient.   Past Surgical History:  Procedure Laterality Date  . FRACTURE SURGERY      Prior to Admission medications   Not on File    Allergies Doxycycline  No family history on file.  Social History Social History   Tobacco Use  . Smoking status: Current Every Day Smoker    Packs/day: 0.25    Types: Cigarettes  . Smokeless tobacco: Current User    Types: Chew  Substance Use Topics  . Alcohol use: Yes    Frequency: Never    Comment: occ  . Drug use: Yes    Types: Methamphetamines    Comment: herion    Review of Systems Constitutional: No fever/chills Eyes: No visual changes. ENT: No sore throat. Cardiovascular: Denies chest pain. Respiratory: Denies shortness of breath. Gastrointestinal: No abdominal pain.  No nausea, no vomiting.  No diarrhea.  No constipation. Genitourinary: Negative for dysuria. Musculoskeletal: Negative for neck pain.  Negative for back pain. Integumentary: Negative for rash. Neurological: Negative  for headaches, focal weakness or numbness. Psychiatric:  Positive for polysubstance use tonight   ____________________________________________   PHYSICAL EXAM:  VITAL SIGNS: ED Triage Vitals  Enc Vitals Group     BP 05/15/19 2254 139/86     Pulse Rate 05/15/19 2231 67     Resp 05/15/19 2231 16     Temp 05/15/19 2231 98.2 F (36.8 C)     Temp Source 05/15/19 2231 Oral     SpO2 05/15/19 2222 95 %     Weight 05/15/19 2232 70.3 kg (155 lb)     Height 05/15/19 2232 1.727 m (5\' 8" )     Head Circumference --      Peak Flow --      Pain Score 05/15/19 2251 0     Pain Loc --      Pain Edu? --      Excl. in [place]? --     Constitutional: Alert and oriented.  Eyes: Conjunctivae are normal.  Mouth/Throat: Mucous membranes are moist. Neck: No stridor.  No meningeal signs.   Cardiovascular: Normal rate, regular rhythm. Good peripheral circulation. Grossly normal heart sounds. Respiratory: Normal respiratory effort.  No retractions. Gastrointestinal: Soft and nontender. No distention.   Musculoskeletal: No lower extremity tenderness nor edema. No gross deformities of extremities. Neurologic:  Normal speech and language. No gross focal neurologic deficits are appreciated.  Skin:  Skin is warm, dry and intact. Psychiatric: Mood and affect are normal. Speech  and behavior are normal.  ____________________________________________   LABS (all labs ordered are listed, but only abnormal results are displayed)  Labs Reviewed  COMPREHENSIVE METABOLIC PANEL - Abnormal; Notable for the following components:      Result Value   Glucose, Bld 103 (*)    Calcium 8.7 (*)    All other components within normal limits  ACETAMINOPHEN LEVEL - Abnormal; Notable for the following components:   Acetaminophen (Tylenol), Serum <10 (*)    All other components within normal limits  CBC - Abnormal; Notable for the following components:   RDW 11.4 (*)    Platelets 416 (*)    All other components within  normal limits  ETHANOL  SALICYLATE LEVEL  URINE DRUG SCREEN, QUALITATIVE ([facility] ONLY)     Procedures   ____________________________________________   INITIAL IMPRESSION / MDM / ASSESSMENT AND PLAN / ED COURSE  As part of my medical decision making, I reviewed the following data within the electronic MEDICAL RECORD NUMBER   28 year old male presenting with above-stated history and physical exam.  Patient denies any suicidal or homicidal ideation.  Patient evaluated by psychiatrist Dr. Percell Miller who stated that the patient does not meet criteria for involuntary commitment and recommend involuntary discharge with outpatient follow-up for polysubstance abuse at this time.  Patient denies suicidal or homicidal ideation. ____________________________________________  FINAL CLINICAL IMPRESSION(S) / ED DIAGNOSES  Final diagnoses:  Polysubstance abuse ([place])     MEDICATIONS GIVEN DURING THIS VISIT:  Medications - No data to display   ED Discharge Orders    None      *Please note:  ARTHOR GORTER was evaluated in Emergency Department on 05/16/2019 for the symptoms described in the history of present illness. He was evaluated in the context of the global COVID-19 pandemic, which necessitated consideration that the patient might be at risk for infection with the SARS-CoV-2 virus that causes COVID-19. Institutional protocols and algorithms that pertain to the evaluation of patients at risk for COVID-19 are in a state of rapid change based on information released by regulatory bodies including the CDC and federal and state organizations. These policies and algorithms were followed during the patient's care in the ED.  Some ED evaluations and interventions may be delayed as a result of limited staffing during the pandemic.*  Note:  This document was prepared using Dragon voice recognition software and may include unintentional dictation errors.   Gregor Hams, MD 05/16/19 2409    Gregor Hams, MD 05/16/19 [phone]

## 2019-05-16 NOTE — BH Assessment (Signed)
Assessment Note  Eric Clarke is an 28 y.o. male who presents to the ER due to his family having concerns about his behaviors. Patient states he and his girlfriend had an augment and he became upset.  Thus, he drank alcohol and smoke cannabis to calm down. Per his report, his mother didn't like how he was acting so she called 911. Patient states this wasn't an attempt to end his life or "to get fucked up (extremely impaired)." Patient states his mother "over reacted."   Patient denies SI/HI and AV/H. He states he had a suicide attempt approximately five years ago and it was when he was in jail. Since then, he haven't had the thoughts, gestures or any attempts. "Yea, it was only  When I was in there (jail)." Patient is requesting to leave. He voice his frustration about been in the ER, because he was suppose to be at work.  Per the request of psychiatrist, Dr. Cindi Carbon, writer provided the patient with information and instructions on how to access Outpatient Mental Health & Substance Abuse Treatment ([facility] and [employer]).   Diagnosis: Substance Use Disorder  Past Medical History: No past medical history on file.  Past Surgical History:  Procedure Laterality Date  . FRACTURE SURGERY      Family History: No family history on file.  Social History:  reports that he has been smoking cigarettes. He has been smoking about 0.25 packs per day. His smokeless tobacco use includes chew. He reports current alcohol use. He reports current drug use. Drug: Methamphetamines.  Additional Social History:  Alcohol / Drug Use Pain Medications: See PTA Prescriptions: See PTA Over the Counter: See PTA History of alcohol / drug use?: Yes Longest period of sobriety (when/how long): Unable to quantify Negative Consequences of Use: Personal relationships, Financial Substance #1 Name of Substance 1: Cannabis 1 - Last Use / Amount: 05/15/2019 Substance #2 Name of Substance 2: Alcohol 2  - Last Use / Amount: 05/15/2019 Substance #3 Name of Substance 3: "Meth" 3 - Last Use / Amount: "About two weeks ago"  CIWA: CIWA-Ar BP: 133/90 Pulse Rate: 65 COWS:    Allergies:  Allergies  Allergen Reactions  . Doxycycline Anaphylaxis    Home Medications: (Not in a hospital admission)   OB/GYN Status:  No LMP for male patient.  General Assessment Data Location of Assessment: [facility] ED TTS Assessment: In system Is this a Tele or Face-to-Face Assessment?: Face-to-Face Is this an Initial Assessment or a Re-assessment for this encounter?: Initial Assessment Language Other than English: No Living Arrangements: Other (Comment) What gender do you identify as?: Male Marital status: Single Pregnancy Status: No Can pt return to current living arrangement?: Yes Admission Status: Voluntary Is patient capable of signing voluntary admission?: Yes Referral Source: Self/Family/Friend Insurance type: None  Medical Screening Exam [facility] Walk-in ONLY) Medical Exam completed: Yes  Crisis Care Plan Legal Guardian: Other:(Self) Name of Psychiatrist: Reports of none Name of Therapist: Reports of none  Education Status Is patient currently in school?: No Is the patient employed, unemployed or receiving disability?: Employed  Risk to self with the past 6 months Suicidal Ideation: No Has patient been a risk to self within the past 6 months prior to admission? : No Suicidal Intent: No Has patient had any suicidal intent within the past 6 months prior to admission? : No Is patient at risk for suicide?: No Suicidal Plan?: No Has patient had any suicidal plan within the past 6 months prior to admission? :  No Access to Means: No What has been your use of drugs/alcohol within the last 12 months?: Cananbis, Alcohol and "Meth" Previous Attempts/Gestures: Yes How many times?: 2 Other Self Harm Risks: Active Drug Use Triggers for Past Attempts: Other (Comment)(When he was in  jail) Intentional Self Injurious Behavior: None Family Suicide History: Unknown Recent stressful life event(s): Other (Comment) Persecutory voices/beliefs?: No Depression: Yes Depression Symptoms: Feeling worthless/self pity Substance abuse history and/or treatment for substance abuse?: No Suicide prevention information given to non-admitted patients: Not applicable  Risk to Others within the past 6 months Homicidal Ideation: No Does patient have any lifetime risk of violence toward others beyond the six months prior to admission? : No Thoughts of Harm to Others: No Current Homicidal Intent: No Current Homicidal Plan: No Access to Homicidal Means: No Identified Victim: Reports of none History of harm to others?: No Assessment of Violence: None Noted Violent Behavior Description: Reports of none Does patient have access to weapons?: No Does patient have a court date: No Is patient on probation?: No  Psychosis Hallucinations: None noted Delusions: None noted  Mental Status Report Appearance/Hygiene: Unremarkable, In scrubs Eye Contact: Good Motor Activity: Freedom of movement, Unremarkable Speech: Logical/coherent, Unremarkable Level of Consciousness: Alert Mood: Sad, Pleasant Affect: Appropriate to circumstance Anxiety Level: Minimal Thought Processes: Coherent, Relevant Judgement: Unimpaired Orientation: Person, Place, Time, Situation, Appropriate for developmental age Obsessive Compulsive Thoughts/Behaviors: Minimal  Cognitive Functioning Concentration: Normal Memory: Recent Intact, Remote Intact Is patient IDD: No Insight: Fair Impulse Control: Fair Appetite: Good Have you had any weight changes? : No Change Sleep: No Change Total Hours of Sleep: 0 Vegetative Symptoms: None  ADLScreening [facility] Assessment Services) Patient's cognitive ability adequate to safely complete daily activities?: Yes Patient able to express need for assistance with ADLs?:  Yes Independently performs ADLs?: Yes (appropriate for developmental age)  Prior Inpatient Therapy Prior Inpatient Therapy: No  Prior Outpatient Therapy Prior Outpatient Therapy: No Does patient have an ACCT team?: No Does patient have Intensive In-House Services?  : No Does patient have [facility] services? : No Does patient have P4CC services?: No  ADL Screening (condition at time of admission) Patient's cognitive ability adequate to safely complete daily activities?: Yes Is the patient deaf or have difficulty hearing?: No Does the patient have difficulty seeing, even when wearing glasses/contacts?: No Does the patient have difficulty concentrating, remembering, or making decisions?: No Patient able to express need for assistance with ADLs?: Yes Does the patient have difficulty dressing or bathing?: No Independently performs ADLs?: Yes (appropriate for developmental age) Does the patient have difficulty walking or climbing stairs?: No Weakness of Legs: None Weakness of Arms/Hands: None  Home Assistive Devices/Equipment Home Assistive Devices/Equipment: None  Therapy Consults (therapy consults require a physician order) PT Evaluation Needed: No OT Evalulation Needed: No SLP Evaluation Needed: No Abuse/Neglect Assessment (Assessment to be complete while patient is alone) Abuse/Neglect Assessment Can Be Completed: Yes Physical Abuse: Denies Verbal Abuse: Denies Sexual Abuse: Denies Exploitation of patient/patient's resources: Denies Self-Neglect: Denies Values / Beliefs Cultural Requests During Hospitalization: None Spiritual Requests During Hospitalization: None Consults Spiritual Care Consult Needed: No Social Work Consult Needed: No Merchant navy officerAdvance Directives (For Healthcare) Does Patient Have a Medical Advance Directive?: No    Child/Adolescent Assessment Running Away Risk: Denies(Patient is an adult)  Disposition: Patient to be discharged when medically cleared  Per  the request of psychiatrist, Dr. Cindi Carbonristofano, writer provided the patient with information and instructions on how to access Outpatient Mental Health & Substance Abuse  Treatment ([facility] and [employer]).   Disposition Initial Assessment Completed for this Encounter: Yes  On Site Evaluation by:   Reviewed with Physician:    Gunnar Fusi MS, LCAS, [facility], [place] Therapeutic Triage Specialist 05/16/2019 11:50 AM

## 2019-05-16 NOTE — ED Notes (Signed)
Went to pt room to discharge and he was not in room - pt left prior to discharge being reviewed with him by provider or this nurse

## 2019-05-16 NOTE — ED Provider Notes (Signed)
Patient has been evaluated by TTS and is cleared for outpatient follow-up.   Earleen Newport, MD 05/16/19 1104

## 2019-05-16 NOTE — ED Notes (Addendum)
Pt requested food and drink - both given  Pt request to be discharged home - provider notified - TTS recommends pt to be discharged

## 2019-05-16 NOTE — ED Notes (Signed)
No discharge signature obtained because pt left

## 2019-05-30 ENCOUNTER — Encounter: Payer: Self-pay | Admitting: Emergency Medicine

## 2019-05-30 ENCOUNTER — Emergency Department
Admission: EM | Admit: 2019-05-30 | Discharge: 2019-05-30 | Disposition: A | Discharge status: Home / Self Care | Payer: Self-pay | Attending: Emergency Medicine | Admitting: Emergency Medicine

## 2019-05-30 ENCOUNTER — Other Ambulatory Visit: Payer: Self-pay

## 2019-05-30 ENCOUNTER — Emergency Department: Payer: Self-pay

## 2019-05-30 DIAGNOSIS — N50811 Right testicular pain: Secondary | ICD-10-CM | POA: Insufficient documentation

## 2019-05-30 DIAGNOSIS — F17210 Nicotine dependence, cigarettes, uncomplicated: Secondary | ICD-10-CM | POA: Insufficient documentation

## 2019-05-30 DIAGNOSIS — N342 Other urethritis: Secondary | ICD-10-CM

## 2019-05-30 DIAGNOSIS — N50812 Left testicular pain: Secondary | ICD-10-CM | POA: Insufficient documentation

## 2019-05-30 DIAGNOSIS — F17220 Nicotine dependence, chewing tobacco, uncomplicated: Secondary | ICD-10-CM | POA: Insufficient documentation

## 2019-05-30 LAB — URINALYSIS, COMPLETE (UACMP) WITH MICROSCOPIC
Bacteria, UA: NONE SEEN
Bilirubin Urine: NEGATIVE
Glucose, UA: NEGATIVE mg/dL
Hgb urine dipstick: NEGATIVE
Ketones, ur: NEGATIVE mg/dL
Leukocytes,Ua: NEGATIVE
Nitrite: NEGATIVE
Protein, ur: 30 mg/dL — AB
Specific Gravity, Urine: 1.024 (ref 1.005–1.030)
pH: 5 (ref 5.0–8.0)

## 2019-05-30 MED ORDER — AZITHROMYCIN 1 G PO PACK
1.0000 g | PACK | Freq: Once | ORAL | Status: DC
  Filled 2019-05-30: qty 1

## 2019-05-30 MED ORDER — CEFTRIAXONE SODIUM 250 MG IJ SOLR
250.0000 mg | Freq: Once | INTRAMUSCULAR | Status: AC
  Administered 2019-05-30: 250 mg via INTRAMUSCULAR
  Filled 2019-05-30: qty 250

## 2019-05-30 MED ORDER — AZITHROMYCIN 500 MG PO TABS
1000.0000 mg | ORAL_TABLET | Freq: Once | ORAL | Status: AC
  Administered 2019-05-30: 20:00:00 1000 mg via ORAL
  Filled 2019-05-30: qty 2

## 2019-05-30 MED ORDER — OXYCODONE-ACETAMINOPHEN 5-325 MG PO TABS
1.0000 | ORAL_TABLET | Freq: Once | ORAL | Status: AC
  Administered 2019-05-30: 1 via ORAL
  Filled 2019-05-30: qty 1

## 2019-05-30 NOTE — ED Provider Notes (Signed)
[facility] Emergency Department Provider Note   ____________________________________________   First MD Initiated Contact with Patient 05/30/19 1851     (approximate)  I have reviewed the triage vital signs and the nursing notes.   HISTORY  Chief Complaint Testicle Pain    HPI Eric Clarke is a 28 y.o. male with no significant past medical history who presents to the ED complain of testicular pain.  Patient reports approximately 3 weeks of bilateral testicular pain, left greater than right.  Pain is constant and sharp, exacerbated when he goes to take a bowel movement or with any palpation of his testicle.  This been associated with some burning when he urinates as well as penile discharge.  He has not noticed any swelling in his groin or changes to the skin over his penis or scrotum.  He denies any trauma to the area and has not had similar symptoms in the past.        History reviewed. No pertinent past medical history.  There are no active problems to display for this patient.   Past Surgical History:  Procedure Laterality Date  . FRACTURE SURGERY      Prior to Admission medications   Not on File    Allergies Doxycycline  History reviewed. No pertinent family history.  Social History Social History   Tobacco Use  . Smoking status: Current Every Day Smoker    Packs/day: 0.25    Types: Cigarettes  . Smokeless tobacco: Current User    Types: Chew  Substance Use Topics  . Alcohol use: Yes    Frequency: Never    Comment: occ  . Drug use: Yes    Types: Methamphetamines    Comment: herion    Review of Systems  Constitutional: No fever/chills Eyes: No visual changes. ENT: No sore throat. Cardiovascular: Denies chest pain. Respiratory: Denies shortness of breath. Gastrointestinal: No abdominal pain.  No nausea, no vomiting.  No diarrhea.  No constipation. Genitourinary: Positive for penile discharge and dysuria.   Positive for testicular pain. Musculoskeletal: Negative for back pain. Skin: Negative for rash. Neurological: Negative for headaches, focal weakness or numbness.  ____________________________________________   PHYSICAL EXAM:  VITAL SIGNS: ED Triage Vitals  Enc Vitals Group     BP 05/30/19 1726 (!) 144/104     Pulse Rate 05/30/19 1726 99     Resp 05/30/19 1726 18     Temp 05/30/19 1726 98.9 F (37.2 C)     Temp Source 05/30/19 1726 Oral     SpO2 05/30/19 1726 97 %     Weight 05/30/19 1723 155 lb (70.3 kg)     Height 05/30/19 1723 5\' 8"  (1.727 m)     Head Circumference --      Peak Flow --      Pain Score --      Pain Loc --      Pain Edu? --      Excl. in [place]? --     Constitutional: Alert and oriented. Eyes: Conjunctivae are normal. Head: Atraumatic. Nose: No congestion/rhinnorhea. Mouth/Throat: Mucous membranes are moist. Neck: Normal ROM Cardiovascular: Normal rate, regular rhythm. Grossly normal heart sounds. Respiratory: Normal respiratory effort.  No retractions. Lungs CTAB. Gastrointestinal: Soft and nontender. No distention. Genitourinary: Diffuse tenderness to bilateral testes, left greater than right.  No masses or hernias appreciated.  No lymphadenopathy or penile lesions noted. Musculoskeletal: No lower extremity tenderness nor edema. Neurologic:  Normal speech and language. No gross focal neurologic  deficits are appreciated. Skin:  Skin is warm, dry and intact. No rash noted. Psychiatric: Mood and affect are normal. Speech and behavior are normal.  ____________________________________________   LABS (all labs ordered are listed, but only abnormal results are displayed)  Labs Reviewed  URINALYSIS, COMPLETE (UACMP) WITH MICROSCOPIC - Abnormal; Notable for the following components:      Result Value   Color, Urine YELLOW (*)    APPearance HAZY (*)    Protein, ur 30 (*)    All other components within normal limits  GC/CHLAMYDIA PROBE AMP      PROCEDURES  Procedure(s) performed (including Critical Care):  Procedures   ____________________________________________   INITIAL IMPRESSION / ASSESSMENT AND PLAN / ED COURSE       28 year old male presents to the ED complaining of 3 weeks of gradually worsening testicular pain, left greater than right.  His ultrasound is negative for acute process, no evidence of epididymitis or torsion.  No evidence of hernia or mass on exam.  However, given his described penile discharge and dysuria, will treat for urethritis with Rocephin and azithromycin.  No evidence of UTI on UA.  Counseled patient to follow-up with urology and return to the ED for new or worsening symptoms.      ____________________________________________   FINAL CLINICAL IMPRESSION(S) / ED DIAGNOSES  Final diagnoses:  Testicular pain, left     ED Discharge Orders    None       Note:  This document was prepared using Dragon voice recognition software and may include unintentional dictation errors.   Chesley Noon, MD 05/30/19 2139

## 2019-05-30 NOTE — ED Triage Notes (Signed)
Pt presents to ED via POV with c/o testicular pain x 3 weeks, pt states pain on both sides however worse on the L, pt states pain is worse after having a BM.

## 2019-05-30 NOTE — ED Notes (Signed)
Patient discharged to home per MD order. Patient in stable condition, and deemed medically cleared by ED provider for discharge. Discharge instructions reviewed with patient/family using "Teach Back"; verbalized understanding of medication education and administration, and information about follow-up care. Denies further concerns. ° °

## 2019-05-30 NOTE — ED Notes (Signed)
Spoke with MD regarding patient's care, see orders.

## 2019-06-03 LAB — GC/CHLAMYDIA PROBE AMP
Chlamydia trachomatis, NAA: NEGATIVE
Neisseria Gonorrhoeae by PCR: NEGATIVE

## 2020-04-04 ENCOUNTER — Emergency Department: Payer: Self-pay

## 2020-04-04 ENCOUNTER — Other Ambulatory Visit: Payer: Self-pay

## 2020-04-04 ENCOUNTER — Encounter: Payer: Self-pay | Admitting: Emergency Medicine

## 2020-04-04 ENCOUNTER — Emergency Department
Admission: EM | Admit: 2020-04-04 | Discharge: 2020-04-04 | Disposition: A | Discharge status: Home / Self Care | Payer: Self-pay | Attending: Emergency Medicine | Admitting: Emergency Medicine

## 2020-04-04 DIAGNOSIS — Z79899 Other long term (current) drug therapy: Secondary | ICD-10-CM | POA: Insufficient documentation

## 2020-04-04 DIAGNOSIS — F1520 Other stimulant dependence, uncomplicated: Secondary | ICD-10-CM | POA: Insufficient documentation

## 2020-04-04 DIAGNOSIS — F17210 Nicotine dependence, cigarettes, uncomplicated: Secondary | ICD-10-CM | POA: Insufficient documentation

## 2020-04-04 DIAGNOSIS — L02211 Cutaneous abscess of abdominal wall: Secondary | ICD-10-CM | POA: Insufficient documentation

## 2020-04-04 LAB — CBC WITH DIFFERENTIAL/PLATELET
Abs Immature Granulocytes: 0.06 10*3/uL (ref 0.00–0.07)
Basophils Absolute: 0 10*3/uL (ref 0.0–0.1)
Basophils Relative: 0 %
Eosinophils Absolute: 0.2 10*3/uL (ref 0.0–0.5)
Eosinophils Relative: 1 %
HCT: 48.8 % (ref 39.0–52.0)
Hemoglobin: 17.4 g/dL — ABNORMAL HIGH (ref 13.0–17.0)
Immature Granulocytes: 1 %
Lymphocytes Relative: 22 %
Lymphs Abs: 2.4 10*3/uL (ref 0.7–4.0)
MCH: 32.2 pg (ref 26.0–34.0)
MCHC: 35.7 g/dL (ref 30.0–36.0)
MCV: 90.2 fL (ref 80.0–100.0)
Monocytes Absolute: 1.5 10*3/uL — ABNORMAL HIGH (ref 0.1–1.0)
Monocytes Relative: 14 %
Neutro Abs: 7.1 10*3/uL (ref 1.7–7.7)
Neutrophils Relative %: 62 %
Platelets: 351 10*3/uL (ref 150–400)
RBC: 5.41 MIL/uL (ref 4.22–5.81)
RDW: 11.9 % (ref 11.5–15.5)
WBC: 11.2 10*3/uL — ABNORMAL HIGH (ref 4.0–10.5)
nRBC: 0 % (ref 0.0–0.2)

## 2020-04-04 LAB — COMPREHENSIVE METABOLIC PANEL
ALT: 73 U/L — ABNORMAL HIGH (ref 0–44)
AST: 34 U/L (ref 15–41)
Albumin: 3.9 g/dL (ref 3.5–5.0)
Alkaline Phosphatase: 76 U/L (ref 38–126)
Anion gap: 12 (ref 5–15)
BUN: 16 mg/dL (ref 6–20)
CO2: 26 mmol/L (ref 22–32)
Calcium: 9.2 mg/dL (ref 8.9–10.3)
Chloride: 95 mmol/L — ABNORMAL LOW (ref 98–111)
Creatinine, Ser: 0.72 mg/dL (ref 0.61–1.24)
GFR calc Af Amer: >60 mL/min (ref >60)
GFR calc non Af Amer: >60 mL/min (ref >60)
Glucose, Bld: 74 mg/dL (ref 70–99)
Potassium: 4.3 mmol/L (ref 3.5–5.1)
Sodium: 133 mmol/L — ABNORMAL LOW (ref 135–145)
Total Bilirubin: 1.2 mg/dL (ref 0.3–1.2)
Total Protein: 7.5 g/dL (ref 6.5–8.1)

## 2020-04-04 LAB — LACTIC ACID, PLASMA: Lactic Acid, Venous: 1.9 mmol/L (ref 0.5–1.9)

## 2020-04-04 MED ORDER — CLINDAMYCIN HCL 300 MG PO CAPS: 300.0000 mg | ORAL_CAPSULE | Freq: Four times a day (QID) | ORAL | 0 refills | Status: DC

## 2020-04-04 MED ORDER — HYDROCODONE-ACETAMINOPHEN 5-325 MG PO TABS: 1.0000 | ORAL_TABLET | ORAL | 0 refills | Status: DC | PRN

## 2020-04-04 MED ORDER — FENTANYL CITRATE (PF) 100 MCG/2ML IJ SOLN
50.0000 ug | INTRAMUSCULAR | Status: DC | PRN
  Administered 2020-04-04: 50 ug via INTRAVENOUS
  Filled 2020-04-04: qty 2

## 2020-04-04 MED ORDER — CLINDAMYCIN PHOSPHATE 600 MG/50ML IV SOLN
600.0000 mg | Freq: Once | INTRAVENOUS | Status: AC
  Administered 2020-04-04: 600 mg via INTRAVENOUS
  Filled 2020-04-04: qty 50

## 2020-04-04 MED ORDER — IOHEXOL 300 MG/ML  SOLN
100.0000 mL | Freq: Once | INTRAMUSCULAR | Status: AC | PRN
  Administered 2020-04-04: 100 mL via INTRAVENOUS

## 2020-04-04 MED ORDER — ONDANSETRON HCL 4 MG/2ML IJ SOLN
4.0000 mg | Freq: Once | INTRAMUSCULAR | Status: AC
  Administered 2020-04-04: 4 mg via INTRAVENOUS
  Filled 2020-04-04: qty 2

## 2020-04-04 MED ORDER — MORPHINE SULFATE (PF) 4 MG/ML IV SOLN
4.0000 mg | Freq: Once | INTRAVENOUS | Status: AC
  Administered 2020-04-04: 4 mg via INTRAVENOUS
  Filled 2020-04-04: qty 1

## 2020-04-04 NOTE — ED Provider Notes (Signed)
[facility] Emergency Department Provider Note  ____________________________________________  Time seen: Approximately 7:13 PM  I have reviewed the triage vital signs and the nursing notes.   HISTORY  Chief Complaint Abscess    HPI Eric Clarke is a 29 y.o. male who presents the emergency department complaining of possible abscess to the right abdominal wall.  Patient has had symptoms for approximately a week.  Patient tried to incise and drain the abscess at home with a sterilized razor blade.  Patient states that this initially improved his symptoms but then developed increasing pain, erythema to this area.  Patient is having ongoing drainage from the site.  No fevers, chills, nausea, vomiting, diarrhea constipation.  Patient presents given the worsening pain, erythema and edema.  No history of recurrent skin infections.  Unknown over-the-counter medications for symptom relief, no other medications with complaint.         History reviewed. No pertinent past medical history.  There are no problems to display for this patient.   Past Surgical History:  Procedure Laterality Date   FRACTURE SURGERY      Prior to Admission medications   Medication Sig Start Date End Date Taking? Authorizing Provider  clindamycin (CLEOCIN) 300 MG capsule Take 1 capsule (300 mg total) by mouth 4 (four) times daily. 04/04/20   Rosaleen Mazer, Delorise Royals, PA-C  HYDROcodone-acetaminophen (NORCO/VICODIN) 5-325 MG tablet Take 1 tablet by mouth every 4 (four) hours as needed for moderate pain. 04/04/20   Llewellyn Choplin, Delorise Royals, PA-C    Allergies Doxycycline  History reviewed. No pertinent family history.  Social History Social History   Tobacco Use   Smoking status: Current Every Day Smoker    Packs/day: 0.25    Types: Cigarettes   Smokeless tobacco: Current User    Types: Chew  Substance Use Topics   Alcohol use: Yes    Comment: occ   Drug use: Yes    Types:  Methamphetamines    Comment: herion     Review of Systems  Constitutional: No fever/chills Eyes: No visual changes. No discharge ENT: No upper respiratory complaints. Cardiovascular: no chest pain. Respiratory: no cough. No SOB. Gastrointestinal: No abdominal pain.  No nausea, no vomiting.  No diarrhea.  No constipation. Musculoskeletal: Negative for musculoskeletal pain. Skin: Negative for rash, abrasions, lacerations, ecchymosis.  Possible abscess to the right abdominal wall Neurological: Negative for headaches, focal weakness or numbness. 10-point ROS otherwise negative.  ____________________________________________   PHYSICAL EXAM:  VITAL SIGNS: ED Triage Vitals  Enc Vitals Group     BP 04/04/20 1621 (!) 124/100     Pulse Rate 04/04/20 1621 (!) 106     Resp 04/04/20 1621 16     Temp 04/04/20 1621 98.2 F (36.8 C)     Temp Source 04/04/20 1621 Oral     SpO2 04/04/20 1621 100 %     Weight 04/04/20 1616 150 lb (68 kg)     Height 04/04/20 1616 5\' 8"  (1.727 m)     Head Circumference --      Peak Flow --      Pain Score 04/04/20 1616 10     Pain Loc --      Pain Edu? --      Excl. in GC? --      Constitutional: Alert and oriented. Well appearing and in no acute distress. Eyes: Conjunctivae are normal. PERRL. EOMI. Head: Atraumatic. ENT:      Ears:       Nose: No  congestion/rhinnorhea.      Mouth/Throat: Mucous membranes are moist.  Neck: No stridor.    Cardiovascular: Normal rate, regular rhythm. Normal S1 and S2.  Good peripheral circulation. Respiratory: Normal respiratory effort without tachypnea or retractions. Lungs CTAB. Good air entry to the bases with no decreased or absent breath sounds. Gastrointestinal: Visualization of the abdominal wall in the right side reveals erythematous, edematous lesion that is draining purulent material.  Opening measures approximately 1 cm in diameter.  Total erythema measures approximately 8 cm in diameter.  Patient is  diffusely tender along the abdominal wall the right side.  Palpation does express additional purulent material from the site.  No frank fluctuance or induration felt outside of area that is already open and draining.  Bowel sounds 4 quadrants. Soft and nontender to palpation. No guarding or rigidity. No palpable masses. No distention. No CVA tenderness. Musculoskeletal: Full range of motion to all extremities. No gross deformities appreciated. Neurologic:  Normal speech and language. No gross focal neurologic deficits are appreciated.  Skin:  Skin is warm, dry and intact. No rash noted. Psychiatric: Mood and affect are normal. Speech and behavior are normal. Patient exhibits appropriate insight and judgement.   ____________________________________________   LABS (all labs ordered are listed, but only abnormal results are displayed)  Labs Reviewed  CBC WITH DIFFERENTIAL/PLATELET - Abnormal; Notable for the following components:      Result Value   WBC 11.2 (*)    Hemoglobin 17.4 (*)    Monocytes Absolute 1.5 (*)    All other components within normal limits  COMPREHENSIVE METABOLIC PANEL - Abnormal; Notable for the following components:   Sodium 133 (*)    Chloride 95 (*)    ALT 73 (*)    All other components within normal limits  LACTIC ACID, PLASMA  LACTIC ACID, PLASMA   ____________________________________________  EKG   ____________________________________________  RADIOLOGY I personally viewed and evaluated these images as part of my medical decision making, as well as reviewing the written report by the radiologist.  CT ABDOMEN PELVIS W CONTRAST  Result Date: 04/04/2020 CLINICAL DATA:  Abdominal wall abscess, abdominal pain, right groin pain EXAM: CT ABDOMEN AND PELVIS WITH CONTRAST TECHNIQUE: Multidetector CT imaging of the abdomen and pelvis was performed using the standard protocol following bolus administration of intravenous contrast. CONTRAST:  OMNIPAQUE IOHEXOL  300 MG/ML  SOLN COMPARISON:  None. FINDINGS: Lower chest: Visualized lung bases are clear. The visualized heart and pericardium are unremarkable. Hepatobiliary: Tiny probable cysts within the right hepatic lobe. The liver and gallbladder are otherwise unremarkable. No intra or extrahepatic biliary ductal dilation. Pancreas: Unremarkable Spleen: Unremarkable Adrenals/Urinary Tract: Unremarkable Stomach/Bowel: Stomach is within normal limits. Appendix appears normal. No evidence of bowel wall thickening, distention, or inflammatory changes. Vascular/Lymphatic: No significant vascular findings are present. No enlarged abdominal or pelvic lymph nodes. Reproductive: Uterus and bilateral adnexa are unremarkable. Other: No ascites.  Rectum unremarkable. Musculoskeletal: There is extensive subcutaneous infiltration predominantly involving the right anterior abdominal wall, likely related to local inflammation or trauma, extending from the a inferior thoracic cage to the right inguinal region. There is more focal dermal thickening and a small subcutaneous rim enhancing fluid collection seen on axial image # 33/2 and coronal image # 16/5 compatible with a small associated subcutaneous abscess or phlegmon. This measures 1.7 x 1.0 x 1.4 cm in greatest dimension. No radiopaque foreign body is identified. No acute bone abnormality. IMPRESSION: Extensive subcutaneous infiltration predominantly involving the right anterior abdominal wall,  likely related to local inflammation or trauma, extending from the inferior thoracic cage to the right inguinal region. Focal 1.7 cm associated subcutaneous abscess or phlegmon with associated dermal thickening superficially within the right upper quadrant abdominal wall. Electronically Signed   By: Helyn Numbers MD   On: 04/04/2020 18:03    ____________________________________________    PROCEDURES  Procedure(s) performed:    Procedures    Medications  fentaNYL (SUBLIMAZE)  injection 50 mcg (50 mcg Intravenous Given 04/04/20 1632)  clindamycin (CLEOCIN) IVPB 600 mg (has no administration in time range)  morphine 4 MG/ML injection 4 mg (has no administration in time range)  ondansetron (ZOFRAN) injection 4 mg (has no administration in time range)  ondansetron (ZOFRAN) injection 4 mg (4 mg Intravenous Given 04/04/20 1632)  iohexol (OMNIPAQUE) 300 MG/ML solution 100 mL (100 mLs Intravenous Contrast Given 04/04/20 1746)     ____________________________________________   INITIAL IMPRESSION / ASSESSMENT AND PLAN / ED COURSE  Pertinent labs & imaging results that were available during my care of the patient were reviewed by me and considered in my medical decision making (see chart for details).  Review of the [place] CSRS was performed in accordance of the [facility] prior to dispensing any controlled drugs.           Patient's diagnosis is consistent with abscess of the abdominal wall.  Patient presented to the emergency department with findings consistent with abscess to the right abdominal wall.  I had seen the patient in triage and ordered labs and imaging from triage.  Patient was brought back to one of my rooms and I was able to see the patient here in the emergency department.  Findings consistent with abscess with cellulitic changes identified on CT.  Area of note for abscess is already open and draining.  No additional incision and drainage at this time.  Patient will be given a dose of IV clindamycin, pain medication here in the emergency department.. Patient will be discharged home with prescriptions for clindamycin and Norco. Patient is to follow up with primary care as needed or otherwise directed. Patient is given ED precautions to return to the ED for any worsening or new symptoms.     ____________________________________________  FINAL CLINICAL IMPRESSION(S) / ED DIAGNOSES  Final diagnoses:  Abscess of abdominal wall      NEW MEDICATIONS STARTED DURING  THIS VISIT:  ED Discharge Orders         Ordered    clindamycin (CLEOCIN) 300 MG capsule  4 times daily        04/04/20 1940    HYDROcodone-acetaminophen (NORCO/VICODIN) 5-325 MG tablet  Every 4 hours PRN        04/04/20 1940              This chart was dictated using voice recognition software/Dragon. Despite best efforts to proofread, errors can occur which can change the meaning. Any change was purely unintentional.    Racheal Patches, PA-C 04/04/20 1941    Delton Prairie, MD 04/04/20 2352

## 2020-04-04 NOTE — ED Triage Notes (Signed)
Pt has abscess to right abdomen. Pt tried to drain it with a razor blade.  Has not had it looked at by doctor.  Abscess with induration noted.  No known fever.  Pain to groin and back as well.

## 2020-04-04 NOTE — ED Triage Notes (Cosign Needed)
Emergency Medicine Provider Triage Evaluation Note  EDGERRIN CORREIA , a 29 y.o. male  was evaluated in triage.  Pt complains of possible abscess to the right abdominal wall.  Patient has had a week worth of symptoms.  Patient tried to drain it at home with a razor blade.  Area is painful, edematous, erythematous.  Patient is now experiencing abdominal pain radiating from his right groin into his right back.  Possible abscess is on the right side.  No fevers or chills.  Patient denies any URI symptoms.  Patient has had no diarrhea, constipation, dysuria, polyuria, hematuria.  No chronic abdominal complaints.  No history of nephrolithiasis..  Review of Systems  Positive: Possible abscess to the right abdominal wall, now right-sided abdominal pain Negative: Nausea, vomiting, diarrhea, dysuria, polyuria, hematuria, fevers, chills  Physical Exam  BP (!) 124/100   Pulse (!) 106   Temp 98.2 F (36.8 C) (Oral)   Resp 16   Ht 5\' 8"  (1.727 m)   Wt 68 kg   SpO2 100%   BMI 22.81 kg/m  Gen:   Awake, no distress   HEENT:  Atraumatic  Resp:  Normal effort with no wheezing or rales Cardiac:  Normal rate and rhythm Abd:   Nondistended, visualized right-sided abdominal wall reveals an erythematous, edematous lesion consistent with abscess with firmness, tenderness, fluctuance.  Patient is experiencing diffuse right-sided tenderness to palpation.  No palpable abnormality.  Negative for Rovsing's and obturator's. MSK:   Moves extremities without difficulty with no deformities Neuro:  Speech clear   Medical Decision Making  Medically screening exam initiated at 4:29 PM.  Appropriate orders placed.  was informed that the remainder of the evaluation will be completed by another provider, this initial triage assessment does not replace that evaluation, and the importance of remaining in the ED until their evaluation is complete.  Clinical Impression  Patient presented to emergency  department with possible abscess to the right abdominal wall.  Findings are consistent with abdominal wall abscess.  Patient is now experiencing right-sided abdominal pain as well.  Patient attempted to drain this with a razor blade at home.  Given the findings on physical exam, patient's current complaints he will be evaluated with labs, CT scan of the abdomen and pelvis.  Patient has been evaluated by myself, will remain in the waiting room while work-up is being performed.  Final diagnosis and disposition will likely be provided by another provider at this time.  Patient has been made aware that he has been medically screened, we will be ordering work-up at this time and will be following the results.   Baird Kay, PA-C 04/04/20 1642

## 2020-04-17 ENCOUNTER — Emergency Department
Admission: EM | Admit: 2020-04-17 | Discharge: 2020-04-18 | Disposition: A | Discharge status: Home / Self Care | Payer: Self-pay | Attending: Emergency Medicine | Admitting: Emergency Medicine

## 2020-04-17 DIAGNOSIS — J189 Pneumonia, unspecified organism: Secondary | ICD-10-CM

## 2020-04-17 DIAGNOSIS — F17210 Nicotine dependence, cigarettes, uncomplicated: Secondary | ICD-10-CM | POA: Insufficient documentation

## 2020-04-17 DIAGNOSIS — T401X1A Poisoning by heroin, accidental (unintentional), initial encounter: Secondary | ICD-10-CM

## 2020-04-17 DIAGNOSIS — E876 Hypokalemia: Secondary | ICD-10-CM

## 2020-04-17 DIAGNOSIS — T50901A Poisoning by unspecified drugs, medicaments and biological substances, accidental (unintentional), initial encounter: Secondary | ICD-10-CM | POA: Insufficient documentation

## 2020-04-17 DIAGNOSIS — Z20822 Contact with and (suspected) exposure to covid-19: Secondary | ICD-10-CM | POA: Insufficient documentation

## 2020-04-17 LAB — COMPREHENSIVE METABOLIC PANEL
ALT: 32 U/L (ref 0–44)
AST: 29 U/L (ref 15–41)
Albumin: 3.8 g/dL (ref 3.5–5.0)
Alkaline Phosphatase: 52 U/L (ref 38–126)
Anion gap: 8 (ref 5–15)
BUN: 21 mg/dL — ABNORMAL HIGH (ref 6–20)
CO2: 29 mmol/L (ref 22–32)
Calcium: 8.2 mg/dL — ABNORMAL LOW (ref 8.9–10.3)
Chloride: 101 mmol/L (ref 98–111)
Creatinine, Ser: 0.89 mg/dL (ref 0.61–1.24)
GFR calc Af Amer: >60 mL/min (ref >60)
GFR calc non Af Amer: >60 mL/min (ref >60)
Glucose, Bld: 110 mg/dL — ABNORMAL HIGH (ref 70–99)
Potassium: 3 mmol/L — ABNORMAL LOW (ref 3.5–5.1)
Sodium: 138 mmol/L (ref 135–145)
Total Bilirubin: 0.6 mg/dL (ref 0.3–1.2)
Total Protein: 6.5 g/dL (ref 6.5–8.1)

## 2020-04-17 LAB — CBC WITH DIFFERENTIAL/PLATELET
Abs Immature Granulocytes: 0.04 10*3/uL (ref 0.00–0.07)
Basophils Absolute: 0 10*3/uL (ref 0.0–0.1)
Basophils Relative: 0 %
Eosinophils Absolute: 0.2 10*3/uL (ref 0.0–0.5)
Eosinophils Relative: 2 %
HCT: 39.6 % (ref 39.0–52.0)
Hemoglobin: 13.7 g/dL (ref 13.0–17.0)
Immature Granulocytes: 0 %
Lymphocytes Relative: 28 %
Lymphs Abs: 3.2 10*3/uL (ref 0.7–4.0)
MCH: 31.8 pg (ref 26.0–34.0)
MCHC: 34.6 g/dL (ref 30.0–36.0)
MCV: 91.9 fL (ref 80.0–100.0)
Monocytes Absolute: 0.6 10*3/uL (ref 0.1–1.0)
Monocytes Relative: 5 %
Neutro Abs: 7.5 10*3/uL (ref 1.7–7.7)
Neutrophils Relative %: 65 %
Platelets: 378 10*3/uL (ref 150–400)
RBC: 4.31 MIL/uL (ref 4.22–5.81)
RDW: 11.7 % (ref 11.5–15.5)
WBC: 11.5 10*3/uL — ABNORMAL HIGH (ref 4.0–10.5)
nRBC: 0 % (ref 0.0–0.2)

## 2020-04-17 LAB — ETHANOL: Alcohol, Ethyl (B): <10 mg/dL (ref <10)

## 2020-04-17 LAB — SALICYLATE LEVEL: Salicylate Lvl: <7 mg/dL — ABNORMAL LOW (ref 7.0–30.0)

## 2020-04-17 LAB — ACETAMINOPHEN LEVEL: Acetaminophen (Tylenol), Serum: <10 ug/mL — ABNORMAL LOW (ref 10–30)

## 2020-04-17 MED ORDER — ONDANSETRON HCL 4 MG/2ML IJ SOLN
4.0000 mg | Freq: Once | INTRAMUSCULAR | Status: AC
  Administered 2020-04-17: 4 mg via INTRAVENOUS
  Filled 2020-04-17: qty 2

## 2020-04-17 MED ORDER — LACTATED RINGERS IV BOLUS
1000.0000 mL | Freq: Once | INTRAVENOUS | Status: AC
  Administered 2020-04-17: 1000 mL via INTRAVENOUS

## 2020-04-17 MED ORDER — NALOXONE HCL 2 MG/2ML IJ SOSY: 0.4000 mg | PREFILLED_SYRINGE | Freq: Once | INTRAMUSCULAR | Status: DC

## 2020-04-17 MED ORDER — NALOXONE HCL 2 MG/2ML IJ SOSY
PREFILLED_SYRINGE | INTRAMUSCULAR | Status: AC
  Filled 2020-04-17: qty 2

## 2020-04-17 MED ORDER — ACETAMINOPHEN 500 MG PO TABS
1000.0000 mg | ORAL_TABLET | Freq: Once | ORAL | Status: DC
  Filled 2020-04-17: qty 2

## 2020-04-17 MED ORDER — KETOROLAC TROMETHAMINE 30 MG/ML IJ SOLN
15.0000 mg | Freq: Once | INTRAMUSCULAR | Status: DC
  Filled 2020-04-17: qty 1

## 2020-04-17 NOTE — ED Triage Notes (Addendum)
Pt to ED after a heroin OD this evening. Pt reports having snorted and unknown amount and was found byt family to be unresponsive with agonal respirations. Pt was given 2mg  of IN narcan by Fire and 2 more mg of IV Narcan with EMS (1 mg given in two separate doses in route to ED)  and arrived to ED responsive and talking to staff. No respiratory distress. PT also admitted to MD he used Meth this evening as well as drinking "a beer."  Pt denies SI/HI

## 2020-04-17 NOTE — ED Notes (Signed)
Father at bedside.

## 2020-04-17 NOTE — ED Provider Notes (Signed)
[facility] Emergency Department Provider Note ____________________________________________   First MD Initiated Contact with Patient 04/17/20 2219     (approximate)  I have reviewed the triage vital signs and the nursing notes.  HISTORY  Chief Complaint Drug Overdose   HPI Eric Clarke is a 29 y.o. malewho presents to the ED for evaluation of drug overdose.   Chart review indicates history of polysubstance abuse and visit about 2 weeks ago for superficial abscess to his abdominal wall requiring I&D. Patient self reports polysubstance use with intranasal heroin, methamphetamines and alcohol use.   Patient reports being clean from opiates and heroin for about 1 month before relapsing tonight.  He reports ambulating to a local corner store to get cigarettes for his mother when someone offered to sell him heroin and "in a moment of weakness" he purchased heroin and snorted this when he got home.  Patient reports that he "may have taken too much."  He adamantly denies any suicidality or taking too much on purpose. Denies recent illnesses. Reports compliance with antibiotics for his abdominal wall abscess.  He is currently reporting nausea without vomiting and a 5/10 aching bitemporal headache with associated photophobia.  He denies any falls or head trauma tonight.   No past medical history on file.  There are no problems to display for this patient.   Past Surgical History:  Procedure Laterality Date  . FRACTURE SURGERY      Prior to Admission medications   Medication Sig Start Date End Date Taking? Authorizing Provider  clindamycin (CLEOCIN) 300 MG capsule Take 1 capsule (300 mg total) by mouth 4 (four) times daily. 04/04/20   Cuthriell, Delorise Royals, PA-C  HYDROcodone-acetaminophen (NORCO/VICODIN) 5-325 MG tablet Take 1 tablet by mouth every 4 (four) hours as needed for moderate pain. 04/04/20   Cuthriell, Delorise Royals, PA-C     Allergies Doxycycline  No family history on file.  Social History Social History   Tobacco Use  . Smoking status: Current Every Day Smoker    Packs/day: 0.25    Types: Cigarettes  . Smokeless tobacco: Current User    Types: Chew  Substance Use Topics  . Alcohol use: Yes    Comment: occ  . Drug use: Yes    Types: Methamphetamines    Comment: herion    Review of Systems  Constitutional: No fever/chills Eyes: No visual changes. ENT: No sore throat. Cardiovascular: Denies chest pain. Respiratory: Denies shortness of breath. Gastrointestinal: No abdominal pain.  No nausea, no vomiting.  No diarrhea.  No constipation. Genitourinary: Negative for dysuria. Musculoskeletal: Negative for back pain. Skin: Negative for rash. Neurological: Negative for focal weakness or numbness.  Positive for headache. ____________________________________________   PHYSICAL EXAM:  VITAL SIGNS: Vitals:   04/17/20 2227  BP: (!) 145/103  Pulse: 84  Resp: 16  Temp: 97.9 F (36.6 C)  SpO2: 97%      Constitutional: Alert and oriented.  Sitting up in bed with nasal cannula.  He is somnolent, but awakens to loud voice and maintains attention on me for multiple sentences before drifting off to sleep.. Eyes: Conjunctivae are normal. PERRL. EOMI. Head: Atraumatic. Nose: No congestion/rhinnorhea. Mouth/Throat: Mucous membranes are moist.  Oropharynx non-erythematous. Neck: No stridor. No cervical spine tenderness to palpation. Cardiovascular: Normal rate, regular rhythm. Grossly normal heart sounds.  Good peripheral circulation. Respiratory: Normal respiratory effort.  No retractions. Lungs CTAB. Gastrointestinal: Soft , nondistended, nontender to palpation. No abdominal bruits. No CVA tenderness. Musculoskeletal: No lower  extremity tenderness nor edema.  No joint effusions. No signs of acute trauma. Previous abscess site to his right mid abdomen looks good without any induration or  fluctuance.  No active drainage or expressible purulent material.  Appears to be healing appropriately. Neurologic:  Normal speech and language. No gross focal neurologic deficits are appreciated. No gait instability noted. Skin:  Skin is warm, dry and intact. No rash noted. Psychiatric: Mood and affect are normal. Speech and behavior are normal.  ____________________________________________   LABS (all labs ordered are listed, but only abnormal results are displayed)  Labs Reviewed  CBC WITH DIFFERENTIAL/PLATELET - Abnormal; Notable for the following components:      Result Value   WBC 11.5 (*)    All other components within normal limits  COMPREHENSIVE METABOLIC PANEL - Abnormal; Notable for the following components:   Potassium 3.0 (*)    Glucose, Bld 110 (*)    BUN 21 (*)    Calcium 8.2 (*)    All other components within normal limits  SALICYLATE LEVEL - Abnormal; Notable for the following components:   Salicylate Lvl <7.0 (*)    All other components within normal limits  ACETAMINOPHEN LEVEL - Abnormal; Notable for the following components:   Acetaminophen (Tylenol), Serum <10 (*)    All other components within normal limits  ETHANOL  URINE DRUG SCREEN, QUALITATIVE ([facility] ONLY)    ____________________________________________   PROCEDURES and INTERVENTIONS  Procedure(s) performed (including Critical Care):  Procedures  Medications  lactated ringers bolus 1,000 mL (1,000 mLs Intravenous New Bag/Given 04/17/20 2247)  ondansetron (ZOFRAN) injection 4 mg (4 mg Intravenous Given 04/17/20 2244)  lactated ringers bolus 1,000 mL (1,000 mLs Intravenous New Bag/Given 04/17/20 2247)    ____________________________________________   MDM / ED COURSE  29 year old male with history of polysubstance use presents to the ED after apparent opiate overdose requiring field Narcan administration.  Normal vital signs on room air upon presentation.  Exam demonstrates mild sleepiness, with  patient awakening to loud vocal stimulation and being conversational prior to falling back asleep.  No indications for Narcan administration upon his presentation to the ED.  No evidence of trauma, focal neurologic deficits or distress.  His previous abscess I&D site looks to be healing appropriately.  Will observe the patient in the ED to assess for need for repeat dosing of Narcan and to ensure no psychiatric emergencies upon his awakening.  Patient signed out to Dr. Dolores Frame to facilitate this.  I suspect if he does not require further Narcan administration, it is observed without acute pathology, and is denying suicidality that he will be suitable for discharge.  Clinical Course as of Apr 17 2337  Fri Apr 17, 2020  2310 Patient signed out to oncoming provider, Dr. Dolores Frame   [DS]    Clinical Course User Index [DS] Delton Prairie, MD     ____________________________________________   FINAL CLINICAL IMPRESSION(S) / ED DIAGNOSES  Final diagnoses:  None     ED Discharge Orders    None       Delton Prairie   Note:  This document was prepared using Dragon voice recognition software and may include unintentional dictation errors.   Delton Prairie, MD 04/17/20 [phone]

## 2020-04-17 NOTE — ED Notes (Signed)
PT starting to become more drowsy than upon arrival. Pt still easy to wake but is placed on end tidal for closer monitoring.

## 2020-04-18 ENCOUNTER — Emergency Department: Payer: Self-pay

## 2020-04-18 LAB — URINE DRUG SCREEN, QUALITATIVE (ARMC ONLY)
Amphetamines, Ur Screen: POSITIVE — AB
Barbiturates, Ur Screen: NOT DETECTED
Benzodiazepine, Ur Scrn: NOT DETECTED
Cannabinoid 50 Ng, Ur ~~LOC~~: NOT DETECTED
Cocaine Metabolite,Ur ~~LOC~~: NOT DETECTED
MDMA (Ecstasy)Ur Screen: NOT DETECTED
Methadone Scn, Ur: NOT DETECTED
Opiate, Ur Screen: NOT DETECTED
Phencyclidine (PCP) Ur S: NOT DETECTED
Tricyclic, Ur Screen: NOT DETECTED

## 2020-04-18 LAB — LACTIC ACID, PLASMA: Lactic Acid, Venous: 1.2 mmol/L (ref 0.5–1.9)

## 2020-04-18 LAB — SARS CORONAVIRUS 2 BY RT PCR (HOSPITAL ORDER, PERFORMED IN ~~LOC~~ HOSPITAL LAB): SARS Coronavirus 2: NEGATIVE

## 2020-04-18 MED ORDER — POTASSIUM CHLORIDE CRYS ER 20 MEQ PO TBCR
40.0000 meq | EXTENDED_RELEASE_TABLET | Freq: Once | ORAL | Status: AC
  Administered 2020-04-18: 40 meq via ORAL
  Filled 2020-04-18: qty 2

## 2020-04-18 MED ORDER — AZITHROMYCIN 250 MG PO TABS: 250.0000 mg | ORAL_TABLET | Freq: Every day | ORAL | 0 refills | Status: DC

## 2020-04-18 MED ORDER — SODIUM CHLORIDE 0.9 % IV SOLN
500.0000 mg | Freq: Once | INTRAVENOUS | Status: AC
  Administered 2020-04-18: 500 mg via INTRAVENOUS
  Filled 2020-04-18: qty 500

## 2020-04-18 MED ORDER — SODIUM CHLORIDE 0.9 % IV SOLN
1.0000 g | Freq: Once | INTRAVENOUS | Status: AC
  Administered 2020-04-18: 1 g via INTRAVENOUS
  Filled 2020-04-18: qty 10

## 2020-04-18 MED ORDER — AMOXICILLIN 500 MG PO CAPS: 500.0000 mg | ORAL_CAPSULE | Freq: Three times a day (TID) | ORAL | 0 refills | Status: DC

## 2020-04-18 NOTE — ED Notes (Signed)
Pt very drowsy. Arousal to verbal stimuli

## 2020-04-18 NOTE — ED Notes (Signed)
Report to hunter, rn.  

## 2020-04-18 NOTE — ED Provider Notes (Signed)
-----------------------------------------   12:44 AM on 04/18/2020 -----------------------------------------  Patient sleeping soundly no acute distress.  Updated patient's mother who is at bedside; she is going home to get some rest but will come back to pick patient up when he is ready for discharge. Eric Clarke [phone]   ----------------------------------------- 3:56 AM on 04/18/2020 -----------------------------------------  Lactic acid and Covid negative.  Room air saturations 95%.  Patient wakes up briefly to stimulation but falls promptly back to sleep.  We will continue to monitor.   ----------------------------------------- 6:58 AM on 04/18/2020 -----------------------------------------  Patient arouses easily to voice.  Offered meal tray; ate a little bit and is currently asleep.  Will observe until patient is more awake.  Anticipate he may be discharged home.  Would discharge on antibiotics for CAP.  Care transferred to Dr. Vicente Males at change of shift.   Irean Hong, MD 04/18/20 [phone]

## 2020-04-18 NOTE — ED Notes (Signed)
Pt awake, meal tray and po fluids provided.

## 2020-04-18 NOTE — ED Notes (Signed)
Pt falling asleep while eating meal tray, arouses easily verbally.

## 2020-04-18 NOTE — ED Notes (Signed)
Oxygen increased from 3L to 4L [place] for oxygen saturation of 91 percent. Saturation increased to 100 percent.

## 2020-04-18 NOTE — ED Notes (Addendum)
Father left and now mother at bedside  Mother wants to speak to EDP, Dr Dolores Frame messaged  Pt assisted with urinal

## 2020-04-18 NOTE — ED Notes (Signed)
Per EDP hold meds until more awake

## 2020-04-18 NOTE — Discharge Instructions (Signed)
1.  Take antibiotics as prescribed: Amoxicillin 500 mg 3 times daily x7 days Azithromycin 250 mg daily x4 days 2.  Return to the ER for worsening symptoms, persistent vomiting, difficulty breathing, lethargy or other concerns.

## 2020-04-18 NOTE — ED Notes (Signed)
Pt reports still sleepy

## 2020-04-18 NOTE — ED Notes (Signed)
Pt will not wake up easily to swallow PO, will wait until pt alert enough to take potassium.

## 2020-04-23 LAB — CULTURE, BLOOD (ROUTINE X 2)
Culture: NO GROWTH
Culture: NO GROWTH
Special Requests: ADEQUATE
Special Requests: ADEQUATE

## 2020-06-14 ENCOUNTER — Encounter: Payer: Self-pay | Admitting: Emergency Medicine

## 2020-06-14 ENCOUNTER — Emergency Department
Admission: EM | Admit: 2020-06-14 | Discharge: 2020-06-14 | Disposition: A | Discharge status: Home / Self Care | Payer: Self-pay | Attending: Student in an Organized Health Care Education/Training Program | Admitting: Student in an Organized Health Care Education/Training Program

## 2020-06-14 ENCOUNTER — Other Ambulatory Visit: Payer: Self-pay

## 2020-06-14 DIAGNOSIS — L02411 Cutaneous abscess of right axilla: Secondary | ICD-10-CM | POA: Insufficient documentation

## 2020-06-14 DIAGNOSIS — F17210 Nicotine dependence, cigarettes, uncomplicated: Secondary | ICD-10-CM | POA: Insufficient documentation

## 2020-06-14 DIAGNOSIS — L0291 Cutaneous abscess, unspecified: Secondary | ICD-10-CM

## 2020-06-14 LAB — RAPID HIV SCREEN (HIV 1/2 AB+AG)
HIV 1/2 Antibodies: NONREACTIVE
HIV-1 P24 Antigen - HIV24: NONREACTIVE

## 2020-06-14 MED ORDER — LIDOCAINE HCL (PF) 1 % IJ SOLN
5.0000 mL | Freq: Once | INTRAMUSCULAR | Status: AC
  Administered 2020-06-14: 5 mL via INTRADERMAL
  Filled 2020-06-14: qty 5

## 2020-06-14 MED ORDER — SULFAMETHOXAZOLE-TRIMETHOPRIM 800-160 MG PO TABS
1.0000 | ORAL_TABLET | Freq: Once | ORAL | Status: AC
  Administered 2020-06-14: 1 via ORAL
  Filled 2020-06-14: qty 1

## 2020-06-14 MED ORDER — LIDOCAINE-PRILOCAINE 2.5-2.5 % EX CREA
TOPICAL_CREAM | Freq: Once | CUTANEOUS | Status: AC
  Filled 2020-06-14: qty 5

## 2020-06-14 MED ORDER — SULFAMETHOXAZOLE-TRIMETHOPRIM 800-160 MG PO TABS: 1.0000 | ORAL_TABLET | Freq: Two times a day (BID) | ORAL | 0 refills | Status: DC

## 2020-06-14 NOTE — ED Provider Notes (Signed)
[facility] Emergency Department Provider Note  ____________________________________________  Time seen: Approximately 11:41 AM  I have reviewed the triage vital signs and the nursing notes.   HISTORY  Chief Complaint Abscess    HPI Eric Clarke is a 29 y.o. male that presents to the emergency department for evaluation of 3 right axilla abscess for 1 week.  Patient has been using a needle to try to pop them.  There has been no drainage.  Patient states that he has had frequent abscesses.  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  No fevers.   History reviewed. No pertinent past medical history.  There are no problems to display for this patient.   Past Surgical History:  Procedure Laterality Date  . FRACTURE SURGERY      Prior to Admission medications   Medication Sig Start Date End Date Taking? Authorizing Provider  amoxicillin (AMOXIL) 500 MG capsule Take 1 capsule (500 mg total) by mouth 3 (three) times daily. 04/18/20   Irean Hong, MD  azithromycin (ZITHROMAX) 250 MG tablet Take 1 tablet (250 mg total) by mouth daily. 04/18/20   Irean Hong, MD  clindamycin (CLEOCIN) 300 MG capsule Take 1 capsule (300 mg total) by mouth 4 (four) times daily. 04/04/20   Cuthriell, Delorise Royals, PA-C  HYDROcodone-acetaminophen (NORCO/VICODIN) 5-325 MG tablet Take 1 tablet by mouth every 4 (four) hours as needed for moderate pain. 04/04/20   Cuthriell, Delorise Royals, PA-C  sulfamethoxazole-trimethoprim (BACTRIM DS) 800-160 MG tablet Take 1 tablet by mouth 2 (two) times daily. 06/14/20   Enid Derry, PA-C    Allergies Doxycycline  No family history on file.  Social History Social History   Tobacco Use  . Smoking status: Current Every Day Smoker    Packs/day: 0.25    Types: Cigarettes  . Smokeless tobacco: Current User    Types: Chew  Substance Use Topics  . Alcohol use: Yes    Comment: occ  . Drug use: Yes    Types: Methamphetamines     Comment: herion     Review of Systems  Constitutional: No fever/chills Cardiovascular: No chest pain. Respiratory: No SOB. Gastrointestinal: No abdominal pain.  No nausea, no vomiting.  Musculoskeletal: Negative for musculoskeletal pain. Skin: Negative for abrasions, lacerations, ecchymosis. Positive for rash. Neurological: Negative for headaches   ____________________________________________   PHYSICAL EXAM:  VITAL SIGNS: ED Triage Vitals  Enc Vitals Group     BP 06/14/20 1056 (!) 161/104     Pulse Rate 06/14/20 1056 (!) 101     Resp 06/14/20 1056 20     Temp 06/14/20 1056 98.2 F (36.8 C)     Temp Source 06/14/20 1056 Oral     SpO2 06/14/20 1056 100 %     Weight 06/14/20 1055 150 lb (68 kg)     Height 06/14/20 1055 5\' 8"  (1.727 m)     Head Circumference --      Peak Flow --      Pain Score 06/14/20 1055 8     Pain Loc --      Pain Edu? --      Excl. in GC? --      Constitutional: Alert and oriented. Well appearing and in no acute distress. Eyes: Conjunctivae are normal. PERRL. EOMI. Head: Atraumatic. ENT:      Ears:      Nose: No congestion/rhinnorhea.      Mouth/Throat: Mucous membranes are moist.  Neck: No stridor.  Cardiovascular: Normal rate, regular rhythm.  Good peripheral circulation. Respiratory: Normal respiratory effort without tachypnea or retractions. Lungs CTAB. Good air entry to the bases with no decreased or absent breath sounds. Musculoskeletal: Full range of motion to all extremities. No gross deformities appreciated. Neurologic:  Normal speech and language. No gross focal neurologic deficits are appreciated.  Skin:  Skin is warm, dry and intact. 2 1cm by 1cm fluctuant masses under right arm proximal to the axilla. 1/2 cm by 1/2cm flucutant draining mass to right axilla. Psychiatric: Mood and affect are normal. Speech and behavior are normal. Patient exhibits appropriate insight and judgement.   ____________________________________________    LABS (all labs ordered are listed, but only abnormal results are displayed)  Labs Reviewed  RAPID HIV SCREEN (HIV 1/2 AB+AG)   ____________________________________________  EKG   ____________________________________________  RADIOLOGY   No results found.  ____________________________________________    PROCEDURES  Procedure(s) performed:    Procedures  INCISION AND DRAINAGE Performed by: Enid Derry Consent: Verbal consent obtained. Risks and benefits: risks, benefits and alternatives were discussed Type: abscess  Body area: axilla  Anesthesia: local infiltration  Incision was made with a scalpel.  Local anesthetic: lidocaine 1 % without epinephrine  Anesthetic total: 2 ml  Complexity: complex Blunt dissection to break up loculations  Drainage: purulent  Drainage amount: minimal  Packing material: none  Patient tolerance: Patient tolerated the procedure well with no immediate complications.  INCISION AND DRAINAGE Performed by: Enid Derry Consent: Verbal consent obtained. Risks and benefits: risks, benefits and alternatives were discussed Type: abscess  Body area: axilla  Anesthesia: local infiltration  Incision was made with a scalpel.  Local anesthetic: lidocaine 1 % without epinephrine  Anesthetic total: 1 ml  Complexity: complex Blunt dissection to break up loculations  Drainage: purulent  Drainage amount: moderate  Packing material: 1/4 in iodoform gauze  Patient tolerance: Patient tolerated the procedure well with no immediate complications.      Medications  lidocaine (PF) (XYLOCAINE) 1 % injection 5 mL (5 mLs Intradermal Given 06/14/20 1351)  lidocaine-prilocaine (EMLA) cream ( Topical Given 06/14/20 1351)  sulfamethoxazole-trimethoprim (BACTRIM DS) 800-160 MG per tablet 1 tablet (1 tablet Oral Given 06/14/20 1351)     ____________________________________________   INITIAL IMPRESSION / ASSESSMENT AND PLAN /  ED COURSE  Pertinent labs & imaging results that were available during my care of the patient were reviewed by me and considered in my medical decision making (see chart for details).  Review of the  [place] was performed in accordance of the [facility] prior to dispensing any controlled drugs.   Patient presented to the emergency department for evaluation of abscess.  Vital signs and exam are reassuring.  To small abscesses were drained in the emergency department.  1 produced moderate purulent drainage and was packed.  The other produced a very minimal amount of purulent drainage and was not packed.  He has another very small one in the axilla that is actively draining.  Patient will be discharged home with prescriptions for Bactrim. Patient is to follow up with primary care as directed. Patient is given ED precautions to return to the ED for any worsening or new symptoms.   Eric Clarke was evaluated in Emergency Department on 06/14/2020 for the symptoms described in the history of present illness. He was evaluated in the context of the global COVID-19 pandemic, which necessitated consideration that the patient might be at risk for infection with the SARS-CoV-2 virus that causes COVID-19. Institutional protocols  and algorithms that pertain to the evaluation of patients at risk for COVID-19 are in a state of rapid change based on information released by regulatory bodies including the CDC and federal and state organizations. These policies and algorithms were followed during the patient's care in the ED.  ____________________________________________  FINAL CLINICAL IMPRESSION(S) / ED DIAGNOSES  Final diagnoses:  Abscess      NEW MEDICATIONS STARTED DURING THIS VISIT:  ED Discharge Orders         Ordered    sulfamethoxazole-trimethoprim (BACTRIM DS) 800-160 MG tablet  2 times daily        06/14/20 1347              This chart was dictated using voice recognition software/Dragon.  Despite best efforts to proofread, errors can occur which can change the meaning. Any change was purely unintentional.    Enid Derry, PA-C 06/14/20 1513    Willy Eddy, MD 06/14/20 1517

## 2020-06-14 NOTE — ED Triage Notes (Signed)
Pt reports abscess under right arm for a week

## 2020-10-05 ENCOUNTER — Encounter: Payer: Self-pay | Admitting: Emergency Medicine

## 2020-10-05 ENCOUNTER — Emergency Department
Admission: EM | Admit: 2020-10-05 | Discharge: 2020-10-05 | Disposition: A | Discharge status: Home / Self Care | Payer: Self-pay | Attending: Emergency Medicine | Admitting: Emergency Medicine

## 2020-10-05 ENCOUNTER — Other Ambulatory Visit: Payer: Self-pay

## 2020-10-05 DIAGNOSIS — Z046 Encounter for general psychiatric examination, requested by authority: Secondary | ICD-10-CM | POA: Insufficient documentation

## 2020-10-05 DIAGNOSIS — F17210 Nicotine dependence, cigarettes, uncomplicated: Secondary | ICD-10-CM | POA: Insufficient documentation

## 2020-10-05 DIAGNOSIS — Z0283 Encounter for blood-alcohol and blood-drug test: Secondary | ICD-10-CM | POA: Insufficient documentation

## 2020-10-05 DIAGNOSIS — R45851 Suicidal ideations: Secondary | ICD-10-CM | POA: Insufficient documentation

## 2020-10-05 DIAGNOSIS — F15959 Other stimulant use, unspecified with stimulant-induced psychotic disorder, unspecified: Secondary | ICD-10-CM | POA: Insufficient documentation

## 2020-10-05 DIAGNOSIS — F15951 Other stimulant use, unspecified with stimulant-induced psychotic disorder with hallucinations: Secondary | ICD-10-CM

## 2020-10-05 DIAGNOSIS — F1920 Other psychoactive substance dependence, uncomplicated: Secondary | ICD-10-CM

## 2020-10-05 DIAGNOSIS — R440 Auditory hallucinations: Secondary | ICD-10-CM | POA: Insufficient documentation

## 2020-10-05 DIAGNOSIS — Z20822 Contact with and (suspected) exposure to covid-19: Secondary | ICD-10-CM | POA: Insufficient documentation

## 2020-10-05 DIAGNOSIS — F1110 Opioid abuse, uncomplicated: Secondary | ICD-10-CM

## 2020-10-05 DIAGNOSIS — F1510 Other stimulant abuse, uncomplicated: Secondary | ICD-10-CM

## 2020-10-05 LAB — COMPREHENSIVE METABOLIC PANEL
ALT: 47 U/L — ABNORMAL HIGH (ref 0–44)
AST: 38 U/L (ref 15–41)
Albumin: 4.2 g/dL (ref 3.5–5.0)
Alkaline Phosphatase: 57 U/L (ref 38–126)
Anion gap: 8 (ref 5–15)
BUN: 28 mg/dL — ABNORMAL HIGH (ref 6–20)
CO2: 28 mmol/L (ref 22–32)
Calcium: 8.6 mg/dL — ABNORMAL LOW (ref 8.9–10.3)
Chloride: 101 mmol/L (ref 98–111)
Creatinine, Ser: 0.83 mg/dL (ref 0.61–1.24)
GFR, Estimated: >60 mL/min (ref >60)
Glucose, Bld: 78 mg/dL (ref 70–99)
Potassium: 3.4 mmol/L — ABNORMAL LOW (ref 3.5–5.1)
Sodium: 137 mmol/L (ref 135–145)
Total Bilirubin: 1.2 mg/dL (ref 0.3–1.2)
Total Protein: 7.2 g/dL (ref 6.5–8.1)

## 2020-10-05 LAB — CBC
HCT: 43.1 % (ref 39.0–52.0)
Hemoglobin: 15 g/dL (ref 13.0–17.0)
MCH: 31.3 pg (ref 26.0–34.0)
MCHC: 34.8 g/dL (ref 30.0–36.0)
MCV: 90 fL (ref 80.0–100.0)
Platelets: 363 10*3/uL (ref 150–400)
RBC: 4.79 MIL/uL (ref 4.22–5.81)
RDW: 12 % (ref 11.5–15.5)
WBC: 11.2 10*3/uL — ABNORMAL HIGH (ref 4.0–10.5)
nRBC: 0 % (ref 0.0–0.2)

## 2020-10-05 LAB — RESP PANEL BY RT-PCR (FLU A&B, COVID) ARPGX2
Influenza A by PCR: NEGATIVE
Influenza B by PCR: NEGATIVE
SARS Coronavirus 2 by RT PCR: NEGATIVE

## 2020-10-05 LAB — ETHANOL: Alcohol, Ethyl (B): <10 mg/dL (ref <10)

## 2020-10-05 MED ORDER — LORAZEPAM 0.5 MG PO TABS
0.5000 mg | ORAL_TABLET | Freq: Once | ORAL | Status: AC
  Administered 2020-10-05: 0.5 mg via ORAL
  Filled 2020-10-05: qty 1

## 2020-10-05 NOTE — ED Provider Notes (Signed)
[facility] Emergency Department Provider Note   ____________________________________________   Event Date/Time   First MD Initiated Contact with Patient 10/05/20 1641     (approximate)  I have reviewed the triage vital signs and the nursing notes.   HISTORY  Chief Complaint No chief complaint on file.    HPI Eric Clarke is a 30 y.o. male with a stated past medical history of polysubstance abuse including methamphetamines who presents for auditory hallucinations over the past 2 years.  Patient states that these auditory hallucinations can vary in what they say however they are usually negative.  Patient states that these voices started while he was in solitary confinement in prison.  Patient states that he has seen a psychiatrist in prison but was never placed on any antipsychotic medications and has not seen anyone since getting out of prison.  Patient does endorse methamphetamine abuse.  Patient endorses IV use with last use 2 days ago.  Patient does state that sometimes methamphetamines make the symptoms worse and sometimes makes them better.  Patient currently denies any vision changes, tinnitus, difficulty speaking, facial droop, sore throat, chest pain, shortness of breath, abdominal pain, nausea/vomiting/diarrhea, dysuria, or weakness/numbness/paresthesias in any extremity         History reviewed. No pertinent past medical history.  There are no problems to display for this patient.   Past Surgical History:  Procedure Laterality Date  . FRACTURE SURGERY      Prior to Admission medications   Medication Sig Start Date End Date Taking? Authorizing Provider  amoxicillin (AMOXIL) 500 MG capsule Take 1 capsule (500 mg total) by mouth 3 (three) times daily. 04/18/20   Irean Hong, MD  azithromycin (ZITHROMAX) 250 MG tablet Take 1 tablet (250 mg total) by mouth daily. 04/18/20   Irean Hong, MD  clindamycin (CLEOCIN) 300 MG capsule Take 1  capsule (300 mg total) by mouth 4 (four) times daily. 04/04/20   Cuthriell, Delorise Royals, PA-C  HYDROcodone-acetaminophen (NORCO/VICODIN) 5-325 MG tablet Take 1 tablet by mouth every 4 (four) hours as needed for moderate pain. 04/04/20   Cuthriell, Delorise Royals, PA-C  sulfamethoxazole-trimethoprim (BACTRIM DS) 800-160 MG tablet Take 1 tablet by mouth 2 (two) times daily. 06/14/20   Enid Derry, PA-C    Allergies Doxycycline  No family history on file.  Social History Social History   Tobacco Use  . Smoking status: Current Every Day Smoker    Packs/day: 0.25    Types: Cigarettes  . Smokeless tobacco: Current User    Types: Chew  Substance Use Topics  . Alcohol use: Yes    Comment: occ  . Drug use: Yes    Types: Methamphetamines    Comment: herion, percocet, saboxone    Review of Systems Constitutional: No fever/chills Eyes: No visual changes. ENT: No sore throat. Cardiovascular: Denies chest pain. Respiratory: Denies shortness of breath. Gastrointestinal: No abdominal pain.  No nausea, no vomiting.  No diarrhea. Genitourinary: Negative for dysuria. Musculoskeletal: Negative for acute arthralgias Skin: Negative for rash. Neurological: Negative for headaches, weakness/numbness/paresthesias in any extremity Psychiatric: Negative for suicidal ideation/homicidal ideation   ____________________________________________   PHYSICAL EXAM:  VITAL SIGNS: ED Triage Vitals  Enc Vitals Group     BP 10/05/20 1627 (!) 133/99     Pulse Rate 10/05/20 1627 84     Resp 10/05/20 1627 16     Temp 10/05/20 1627 97.9 F (36.6 C)     Temp Source 10/05/20 1627 Oral  SpO2 10/05/20 1627 100 %     Weight 10/05/20 1624 149 lb 14.6 oz (68 kg)     Height 10/05/20 1624 5\' 8"  (1.727 m)     Head Circumference --      Peak Flow --      Pain Score 10/05/20 1624 0     Pain Loc --      Pain Edu? --      Excl. in GC? --    Constitutional: Alert and oriented. Well appearing and in no acute  distress. Eyes: Conjunctivae are normal. PERRL. Head: Atraumatic. Nose: No congestion/rhinnorhea. Mouth/Throat: Mucous membranes are moist. Neck: No stridor Cardiovascular: Grossly normal heart sounds.  Good peripheral circulation. Respiratory: Normal respiratory effort.  No retractions. Gastrointestinal: Soft and nontender. No distention. Musculoskeletal: No obvious deformities Neurologic:  Normal speech and language. No gross focal neurologic deficits are appreciated. Skin:  Skin is warm and dry. No rash noted.  Scattered folliculitis over trunk and suprapubic area Psychiatric: Mood is depressed and affect is flat. Speech and behavior are normal.  ____________________________________________   LABS (all labs ordered are listed, but only abnormal results are displayed)  Labs Reviewed  CBC - Abnormal; Notable for the following components:      Result Value   WBC 11.2 (*)    All other components within normal limits  RESP PANEL BY RT-PCR (FLU A&B, COVID) ARPGX2  COMPREHENSIVE METABOLIC PANEL  ETHANOL  URINE DRUG SCREEN, QUALITATIVE ([facility] ONLY)   PROCEDURES  Procedure(s) performed (including Critical Care):  Procedures   ____________________________________________   INITIAL IMPRESSION / ASSESSMENT AND PLAN / ED COURSE  As part of my medical decision making, I reviewed the following data within the electronic MEDICAL RECORD NUMBER Nursing notes reviewed and incorporated, Labs reviewed, EKG interpreted, Old chart reviewed, and Notes from prior ED visits reviewed and incorporated        Patient presents under IVC for hallucinations/delusions. Thoughts are organized. No history of prior suicide attempt, and no SI or HI at this time. Clinically w/ no overt toxidrome, low suspicion for ingestion given hx and exam Thoughts unlikely 2/2 anemia, hypothyroidism, infection, or ICH. Patients decision making capacity is compromised and they are unable to perform all ADLs (additionally  they are without appropriate caretakers to assist through this deficit).  Consult: Psychiatry to evaluate patient for grave disability.  Patient was deemed stable for discharge to her group home  Disposition: The patient has been reexamined and is ready to be discharged.  All diagnostic results have been reviewed and discussed with the patient/family.  Care plan has been outlined and the patient/family understands all current diagnoses, results, and treatment plans.  There are no new complaints, changes, or physical findings at this time.  All questions have been addressed and answered. Patient was instructed to, and agrees to follow-up with their primary care physician as well as return to the emergency department if any new or worsening symptoms develop.      ____________________________________________   FINAL CLINICAL IMPRESSION(S) / ED DIAGNOSES  Final diagnoses:  Auditory hallucinations  Methamphetamine abuse [facility])     ED Discharge Orders    None       Note:  This document was prepared using Dragon voice recognition software and may include unintentional dictation errors.   [facility], MD 10/05/20 [phone]

## 2020-10-05 NOTE — Consult Note (Signed)
[facility] Face-to-Face Psychiatry Consult   Reason for Consult: Consult for 30 year old man who came voluntarily to the emergency room because of worsening psychotic symptoms Referring Physician:  Vicente Males Patient Identification: Eric Clarke  MRN:  [id number] Principal Diagnosis: Amphetamine and psychostimulant-induced psychosis with hallucinations (HCC) Diagnosis:  Principal Problem:   Amphetamine and psychostimulant-induced psychosis with hallucinations (HCC) Active Problems:   Amphetamine abuse (HCC)   Opiate abuse, episodic (HCC)   Total Time spent with patient: 1 hour  Subjective:   Eric Clarke is a 29 y.o. male patient admitted with "I am addicted to methamphetamine and I think that is part of the problem".  HPI: Patient seen chart reviewed.  Patient came in voluntarily after going to [facility] today.  He reports that he has been using methamphetamine IV several times a week last time was 2 days ago.  Also using narcotic pills but denies that he is shooting up any opiates.  Patient reports that he is having hallucinations both visual and auditory happening very frequently.  He describes frequent episodes of such behaviors as running out into the woods in the middle of the night believing that his girlfriend is being killed, hearing and seeing the voices of people he knows, feeling so disorganized in his thinking that his behavior becomes bizarre.  Apparently this morning he tried to go to work and got so confused and agitated he walked home and on the way home jumped into a river.  Patient has some awareness that this is related to his drug abuse.  He denies any suicidal or homicidal thought.  He is not on any prescription medicine.  Past Psychiatric History: Patient has a history of opiate abuse with a past history of IV opiate abuse now abusing methamphetamine more regularly.  Longest period of sobriety was when he was imprisoned.  No history of suicide attempts or violence.  Looking  through his old chart it does not look like the psychotic symptoms were present until pretty recently when he was using meth  Risk to Self:   Risk to Others:   Prior Inpatient Therapy:   Prior Outpatient Therapy:    Past Medical History: History reviewed. No pertinent past medical history.  Past Surgical History:  Procedure Laterality Date  . FRACTURE SURGERY     Family History: No family history on file. Family Psychiatric  History: None reported Social History:  Social History   Substance and Sexual Activity  Alcohol Use Yes   Comment: occ     Social History   Substance and Sexual Activity  Drug Use Yes  . Types: Methamphetamines   Comment: herion, percocet, saboxone    Social History   Socioeconomic History  . Marital status: Single    Spouse name: Not on file  . Number of children: Not on file  . Years of education: Not on file  . Highest education level: Not on file  Occupational History  . Not on file  Tobacco Use  . Smoking status: Current Every Day Smoker    Packs/day: 0.25    Types: Cigarettes  . Smokeless tobacco: Current User    Types: Chew  Substance and Sexual Activity  . Alcohol use: Yes    Comment: occ  . Drug use: Yes    Types: Methamphetamines    Comment: herion, percocet, saboxone  . Sexual activity: Not on file  Other Topics Concern  . Not on file  Social History Narrative  . Not on file   Social Determinants of  Health   Financial Resource Strain: Not on file  Food Insecurity: Not on file  Transportation Needs: Not on file  Physical Activity: Not on file  Stress: Not on file  Social Connections: Not on file   Additional Social History:    Allergies:   Allergies  Allergen Reactions  . Doxycycline Anaphylaxis    Labs:  Results for orders placed or performed during the hospital encounter of 10/05/20 (from the past 48 hour(s))  Comprehensive metabolic panel     Status: Abnormal   Collection Time: 10/05/20  4:37 PM  Result  Value Ref Range   Sodium 137 135 - 145 mmol/L   Potassium 3.4 (L) 3.5 - 5.1 mmol/L   Chloride 101 98 - 111 mmol/L   CO2 28 22 - 32 mmol/L   Glucose, Bld 78 70 - 99 mg/dL    Comment: Glucose reference range applies only to samples taken after fasting for at least 8 hours.   BUN 28 (H) 6 - 20 mg/dL   Creatinine, Ser 1.28 0.61 - 1.24 mg/dL   Calcium 8.6 (L) 8.9 - 10.3 mg/dL   Total Protein 7.2 6.5 - 8.1 g/dL   Albumin 4.2 3.5 - 5.0 g/dL   AST 38 15 - 41 U/L   ALT 47 (H) 0 - 44 U/L   Alkaline Phosphatase 57 38 - 126 U/L   Total Bilirubin 1.2 0.3 - 1.2 mg/dL   GFR, Estimated >78 >67 mL/min    Comment: (NOTE) Calculated using the CKD-EPI Creatinine Equation (2021)    Anion gap 8 5 - 15    Comment: Performed at [facility], [address] [place], [place] [address]  Ethanol     Status: None   Collection Time: 10/05/20  4:37 PM  Result Value Ref Range   Alcohol, Ethyl (B) <10 <10 mg/dL    Comment: (NOTE) Lowest detectable limit for serum alcohol is 10 mg/dL.  For medical purposes only. Performed at [facility], [address] [place], [place] [address]   cbc     Status: Abnormal   Collection Time: 10/05/20  4:37 PM  Result Value Ref Range   WBC 11.2 (H) 4.0 - 10.5 K/uL   RBC 4.79 4.22 - 5.81 MIL/uL   Hemoglobin 15.0 13.0 - 17.0 g/dL   HCT 28.3 66.2 - 94.7 %   MCV 90.0 80.0 - 100.0 fL   MCH 31.3 26.0 - 34.0 pg   MCHC 34.8 30.0 - 36.0 g/dL   RDW 65.4 65.0 - 35.4 %   Platelets 363 150 - 400 K/uL   nRBC 0.0 0.0 - 0.2 %    Comment: Performed at [facility], [address] [place], [place] [address]    No current facility-administered medications for this encounter.   Current Outpatient Medications  Medication Sig Dispense Refill  . amoxicillin (AMOXIL) 500 MG capsule Take 1 capsule (500 mg total) by mouth 3 (three) times daily. 21 capsule 0  . azithromycin (ZITHROMAX) 250 MG tablet Take 1 tablet (250 mg total) by mouth daily. 4 each 0  .  clindamycin (CLEOCIN) 300 MG capsule Take 1 capsule (300 mg total) by mouth 4 (four) times daily. 28 capsule 0  . HYDROcodone-acetaminophen (NORCO/VICODIN) 5-325 MG tablet Take 1 tablet by mouth every 4 (four) hours as needed for moderate pain. 12 tablet 0  . sulfamethoxazole-trimethoprim (BACTRIM DS) 800-160 MG tablet Take 1 tablet by mouth 2 (two) times daily. 20 tablet 0    Musculoskeletal: Strength &  Muscle Tone: within normal limits Gait & Station: normal Patient leans: N/A  Psychiatric Specialty Exam: Physical Exam Vitals and nursing note reviewed.  Constitutional:      Appearance: He is well-developed and well-nourished.  HENT:     Head: Normocephalic and atraumatic.  Eyes:     Conjunctiva/sclera: Conjunctivae normal.     Pupils: Pupils are equal, round, and reactive to light.  Cardiovascular:     Heart sounds: Normal heart sounds.  Pulmonary:     Effort: Pulmonary effort is normal.  Abdominal:     Palpations: Abdomen is soft.  Musculoskeletal:        General: Normal range of motion.     Cervical back: Normal range of motion.  Skin:    General: Skin is warm and dry.  Neurological:     General: No focal deficit present.     Mental Status: He is alert.  Psychiatric:        Attention and Perception: He perceives auditory and visual hallucinations.        Mood and Affect: Mood is anxious.        Speech: Speech normal.        Behavior: Behavior is agitated. Behavior is not aggressive.        Thought Content: Thought content is paranoid. Thought content is not delusional. Thought content does not include homicidal or suicidal ideation.        Cognition and Memory: Memory is impaired.        Judgment: Judgment is impulsive.     Review of Systems  Constitutional: Negative.   HENT: Negative.   Eyes: Negative.   Respiratory: Negative.   Cardiovascular: Negative.   Gastrointestinal: Negative.   Musculoskeletal: Negative.   Skin: Negative.   Neurological: Negative.    Psychiatric/Behavioral: Positive for dysphoric mood and hallucinations. The patient is hyperactive.     Blood pressure (!) 133/99, pulse 84, temperature 97.9 F (36.6 C), temperature source Oral, resp. rate 16, height 5\' 8"  (1.727 m), weight 68 kg, SpO2 100 %.Body mass index is 22.79 kg/m.  General Appearance: Casual  Eye Contact:  Good  Speech:  Clear and Coherent  Volume:  Increased  Mood:  Anxious  Affect:  Congruent  Thought Process:  Disorganized  Orientation:  Full (Time, Place, and Person)  Thought Content:  Hallucinations: Auditory Visual, Rumination and Tangential  Suicidal Thoughts:  No  Homicidal Thoughts:  No  Memory:  Immediate;   Fair Recent;   Fair Remote;   Fair  Judgement:  Fair  Insight:  Fair  Psychomotor Activity:  Restlessness  Concentration:  Concentration: Fair  Recall:  of Knowledge:  Fair  Language:  Fair  Akathisia:  No  Handed:  Right  AIMS (if indicated):     Assets:  Desire for Improvement Housing Physical Health Resilience Social Support  ADL's:  Impaired  Cognition:  Impaired,  Mild  Sleep:        Treatment Plan Summary: Plan 30 year old man who is having amphetamine induced psychotic symptoms.  He is not acutely suicidal or homicidal.  Patient was educated about the nature of amphetamine psychosis and was actually open to and receptive to information.  He states a desire to get involved with substance abuse treatment.  Patient was advised that we do not have beds available tonight and that residential treatment services does not provide detox services for amphetamine abuse.  He was offered the option of staying here at this point since he is  still anxious and having psychotic symptoms but he feels safe and comfortable going home.  Patient was given extensive encouragement and support about substance abuse treatment and strongly encouraged to get in touch with [facility] tomorrow and they will be given his number as well.  No  prescriptions needed.  Disposition: No evidence of imminent risk to self or others at present.   Patient does not meet criteria for psychiatric inpatient admission. Supportive therapy provided about ongoing stressors.  Mordecai RasmussenJohn Abby Stines, MD 10/05/2020 6:02 PM

## 2020-10-05 NOTE — ED Triage Notes (Signed)
Arrives from [facility] for mental health evaluation.  Patient states he is hearing voices x 2 years. States hallucinations have worsened and now having difficulty separating them from what is real.  Patient states he uses Meth and typically hears voices with drug use.  Denies SI/ H I.  Last used Meth 2 days ago.  AAOx3 skin warm and dry.  Calm and cooperative.

## 2020-10-05 NOTE — ED Notes (Signed)
E-signature not working at this time. Pt verbalized understanding of D/C instructions, prescriptions and follow up care with no further questions at this time. Pt in NAD and ambulatory at time of D/C.  

## 2020-10-05 NOTE — BH Assessment (Signed)
Comprehensive Clinical Assessment (CCA) Screening, Triage and Referral Note  10/05/2020 Eric Clarke [id number]   Eric Clarke is an y.o male who presents to [facility] ED voluntarily for treatment. Per triage note, Arrives from [facility] for mental health evaluation.  Patient states he is hearing voices x 2 years. States hallucinations have worsened and now having difficulty separating them from what is real.  Patient states he uses Meth and typically hears voices with drug use.  Denies SI/ H I.  Last used Meth 2 days ago. AAOx3 skin warm and dry.  Calm and cooperative.  During TTS assessment pt presents calm, alert, anxious and oriented x 5, restless but cooperative, and mood-congruent with affect. The pt does not appear to be responding to internal or external stimuli. Neither is the pt presenting with any delusional thinking. Pt was able to verify the information provided to triage RN. Pt identified his main complaint to be increased AH/VH that are occurring frequently stating, "it's hard to separate what's real versus what's not". Pt denies a MH hx or to be taking any prescribed medications. Pt report using methamphetamine (IV & Smoking) several times and the last time to be 2 days ago. Per pt's chart, pt has hx of using Heroine but pt denies any recent use stating, "no heroine it's killed me a few times". Pt also admits to recent use of narcotic pills but denies the use of any opiates. Pt reports endorsing bizarre behaviors such as running into the woods to help save his girlfriend, hearing and seeing familiar people and unorganized thinking resulting to extreme bizarre behaviors such as jumping in a river for no reason. Pt exhibit some insight around his drug abuse, denies any current SI/HI and contacts for safety.   Per Dr. Toni Amend pt does meets criteria for INPT but is reccommended to follow up with [facility] resources provided  Chief Complaint:  Chief Complaint  Patient presents with  . Drug /  Alcohol Assessment   Visit Diagnosis: Amphetamine and psychostimulant- induced psychosis with hallucinations   Patient Reported Information How did you hear about [place]? Self   Referral name: [facility]   Referral phone number: [phone] Whom do you see for routine medical problems? I don't have a doctor   Practice/Facility Name: No data recorded  Practice/Facility Phone Number: No data recorded  Name of Contact: No data recorded  Contact Number: No data recorded  Contact Fax Number: No data recorded  Prescriber Name: No data recorded  Prescriber Address (if known): No data recorded What Is the Reason for Your Visit/Call Today? Hallucinations  How Long Has This Been Causing You Problems? 1 wk - 1 month  Have You Recently Been in Any Inpatient Treatment (Hospital/Detox/Crisis Center/28-Day Program)? No   Name/Location of Program/Hospital:No data recorded  How Long Were You There? No data recorded  When Were You Discharged? No data recorded Have You Ever Received Services From [facility] Before? Yes   Who Do You See at [facility]? ED  Have You Recently Had Any Thoughts About Hurting Yourself? No   Are You Planning to Commit Suicide/Harm Yourself At This time?  No  Have you Recently Had Thoughts About Hurting Someone Karolee Ohs? No   Explanation: No data recorded Have You Used Any Alcohol or Drugs in the Past 24 Hours? Yes   How Long Ago Did You Use Drugs or Alcohol?  No data recorded  What Did You Use and How Much? Meth 2 days ago  What Do You Feel Would  Help You the Most Today? Assessment Only  Do You Currently Have a Therapist/Psychiatrist? No   Name of Therapist/Psychiatrist: No data recorded  Have You Been Recently Discharged From Any Office Practice or Programs? No   Explanation of Discharge From Practice/Program:  No data recorded    CCA Screening Triage Referral Assessment Type of Contact: Face-to-Face   Is this Initial or Reassessment? No data recorded  Date  Telepsych consult ordered in [facility]:  No data recorded  Time Telepsych consult ordered in [facility]:  No data recorded Patient Reported Information Reviewed? Yes   Patient Left Without Being Seen? No data recorded  Reason for Not Completing Assessment: No data recorded Collateral Involvement: None provided  Does Patient Have a Court Appointed Legal Guardian? No data recorded  Name and Contact of Legal Guardian:  No data recorded If Minor and Not Living with Parent(s), Who has Custody? n/a  Is CPS involved or ever been involved? Never  Is APS involved or ever been involved? Never  Patient Determined To Be At Risk for Harm To Self or Others Based on Review of Patient Reported Information or Presenting Complaint? No   Method: No data recorded  Availability of Means: No data recorded  Intent: No data recorded  Notification Required: No data recorded  Additional Information for Danger to Others Potential:  No data recorded  Additional Comments for Danger to Others Potential:  No data recorded  Are There Guns or Other Weapons in Your Home?  No data recorded   Types of Guns/Weapons: No data recorded   Are These Weapons Safely Secured?                              No data recorded   Who Could Verify You Are Able To Have These Secured:    No data recorded Do You Have any Outstanding Charges, Pending Court Dates, Parole/Probation? No data recorded Contacted To Inform of Risk of Harm To Self or Others: No data recorded Location of Assessment: [facility] ED  Does Patient Present under Involuntary Commitment? No   IVC Papers Initial File Date: No data recorded  [place] of Residence: [place]  Patient Currently Receiving the Following Services: Not Receiving Services   Determination of Need: No data recorded  Options For Referral: Outpatient Therapy   Opal Sidles, LCSWA

## 2020-10-05 NOTE — ED Notes (Signed)
Belongings.  Pair of tennis shoes. Blue jeans Brown belt [employer] hat Dip Lighter Orange short sleeve shirt Pair of socks

## 2020-10-15 ENCOUNTER — Emergency Department
Admission: EM | Admit: 2020-10-15 | Discharge: 2020-10-15 | Disposition: A | Discharge status: Home / Self Care | Payer: Self-pay | Attending: Emergency Medicine | Admitting: Emergency Medicine

## 2020-10-15 ENCOUNTER — Other Ambulatory Visit: Payer: Self-pay

## 2020-10-15 DIAGNOSIS — F17210 Nicotine dependence, cigarettes, uncomplicated: Secondary | ICD-10-CM | POA: Insufficient documentation

## 2020-10-15 DIAGNOSIS — L02413 Cutaneous abscess of right upper limb: Secondary | ICD-10-CM | POA: Insufficient documentation

## 2020-10-15 MED ORDER — CEPHALEXIN 500 MG PO CAPS: 500.0000 mg | ORAL_CAPSULE | Freq: Four times a day (QID) | ORAL | 0 refills | Status: AC

## 2020-10-15 MED ORDER — LIDOCAINE-EPINEPHRINE 2 %-1:100000 IJ SOLN
20.0000 mL | Freq: Once | INTRAMUSCULAR | Status: AC
  Administered 2020-10-15: 20 mL via INTRADERMAL

## 2020-10-15 MED ORDER — CEFTRIAXONE SODIUM 1 G IJ SOLR
1.0000 g | Freq: Once | INTRAMUSCULAR | Status: AC
  Administered 2020-10-15: 1 g via INTRAMUSCULAR
  Filled 2020-10-15: qty 10

## 2020-10-15 MED ORDER — SULFAMETHOXAZOLE-TRIMETHOPRIM 800-160 MG PO TABS: 1.0000 | ORAL_TABLET | Freq: Two times a day (BID) | ORAL | 0 refills | Status: AC

## 2020-10-15 NOTE — ED Notes (Signed)
See triage note  Presents with possible abscess area to right upper arm  States he noticed area a few days ago  States he tried to "bust" it himself  Area is red and swollen

## 2020-10-15 NOTE — Discharge Instructions (Addendum)
Please take antibiotics as prescribed.  You may remove packing in 3 days.  Return to the emergency department for any fevers, worsening or if you fail to improve after 3 days of antibiotics.
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
## 2020-10-15 NOTE — ED Triage Notes (Signed)
Pt states coming in with history of "boils." Pt states abscess to the right arm he first noticed 2 days ago. Pt states a yellow/green drainage.

## 2020-11-24 ENCOUNTER — Encounter: Payer: Self-pay | Admitting: Emergency Medicine

## 2020-11-24 ENCOUNTER — Emergency Department
Admission: EM | Admit: 2020-11-24 | Discharge: 2020-11-25 | Disposition: A | Discharge status: Home / Self Care | Payer: Self-pay | Attending: Emergency Medicine | Admitting: Emergency Medicine

## 2020-11-24 ENCOUNTER — Other Ambulatory Visit: Payer: Self-pay

## 2020-11-24 DIAGNOSIS — F1910 Other psychoactive substance abuse, uncomplicated: Secondary | ICD-10-CM | POA: Insufficient documentation

## 2020-11-24 DIAGNOSIS — F1510 Other stimulant abuse, uncomplicated: Secondary | ICD-10-CM | POA: Diagnosis present

## 2020-11-24 DIAGNOSIS — F1110 Opioid abuse, uncomplicated: Secondary | ICD-10-CM | POA: Diagnosis present

## 2020-11-24 DIAGNOSIS — T07XXXA Unspecified multiple injuries, initial encounter: Secondary | ICD-10-CM

## 2020-11-24 DIAGNOSIS — F17210 Nicotine dependence, cigarettes, uncomplicated: Secondary | ICD-10-CM | POA: Insufficient documentation

## 2020-11-24 DIAGNOSIS — S60511A Abrasion of right hand, initial encounter: Secondary | ICD-10-CM | POA: Insufficient documentation

## 2020-11-24 DIAGNOSIS — S60221A Contusion of right hand, initial encounter: Secondary | ICD-10-CM | POA: Insufficient documentation

## 2020-11-24 DIAGNOSIS — W2209XA Striking against other stationary object, initial encounter: Secondary | ICD-10-CM | POA: Insufficient documentation

## 2020-11-24 DIAGNOSIS — Z046 Encounter for general psychiatric examination, requested by authority: Secondary | ICD-10-CM | POA: Insufficient documentation

## 2020-11-24 DIAGNOSIS — Y92009 Unspecified place in unspecified non-institutional (private) residence as the place of occurrence of the external cause: Secondary | ICD-10-CM | POA: Insufficient documentation

## 2020-11-24 DIAGNOSIS — F1514 Other stimulant abuse with stimulant-induced mood disorder: Secondary | ICD-10-CM | POA: Insufficient documentation

## 2020-11-24 DIAGNOSIS — F1920 Other psychoactive substance dependence, uncomplicated: Secondary | ICD-10-CM | POA: Diagnosis present

## 2020-11-24 DIAGNOSIS — F15951 Other stimulant use, unspecified with stimulant-induced psychotic disorder with hallucinations: Secondary | ICD-10-CM | POA: Diagnosis present

## 2020-11-24 LAB — CBC
HCT: 42.6 % (ref 39.0–52.0)
Hemoglobin: 14.8 g/dL (ref 13.0–17.0)
MCH: 31.4 pg (ref 26.0–34.0)
MCHC: 34.7 g/dL (ref 30.0–36.0)
MCV: 90.3 fL (ref 80.0–100.0)
Platelets: 428 10*3/uL — ABNORMAL HIGH (ref 150–400)
RBC: 4.72 MIL/uL (ref 4.22–5.81)
RDW: 12.3 % (ref 11.5–15.5)
WBC: 12 10*3/uL — ABNORMAL HIGH (ref 4.0–10.5)
nRBC: 0 % (ref 0.0–0.2)
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
## 2020-11-24 NOTE — ED Notes (Signed)
Pt. Transferred from Triage to room [facility] after dressing out and screening for contraband. Report to include Situation, Background, Assessment and Recommendations from [employer]. Pt. Oriented to Quad including Q15 minute rounds as well as Psychologist, counselling for their protection. Patient is alert and oriented, warm and dry in no acute distress. Patient denies SI, HI, and AVH. Pt. Encouraged to let me know if needs arise.

## 2020-11-25 ENCOUNTER — Emergency Department: Payer: Self-pay

## 2020-11-25 DIAGNOSIS — F1510 Other stimulant abuse, uncomplicated: Secondary | ICD-10-CM

## 2020-11-25 LAB — COMPREHENSIVE METABOLIC PANEL
ALT: 37 U/L (ref 0–44)
AST: 39 U/L (ref 15–41)
Albumin: 4.1 g/dL (ref 3.5–5.0)
Alkaline Phosphatase: 56 U/L (ref 38–126)
Anion gap: 10 (ref 5–15)
BUN: 25 mg/dL — ABNORMAL HIGH (ref 6–20)
CO2: 26 mmol/L (ref 22–32)
Calcium: 8.9 mg/dL (ref 8.9–10.3)
Chloride: 101 mmol/L (ref 98–111)
Creatinine, Ser: 1.06 mg/dL (ref 0.61–1.24)
GFR, Estimated: >60 mL/min (ref >60)
Glucose, Bld: 78 mg/dL (ref 70–99)
Potassium: 4 mmol/L (ref 3.5–5.1)
Sodium: 137 mmol/L (ref 135–145)
Total Bilirubin: 1.7 mg/dL — ABNORMAL HIGH (ref 0.3–1.2)
Total Protein: 7.1 g/dL (ref 6.5–8.1)

## 2020-11-25 LAB — URINE DRUG SCREEN, QUALITATIVE (ARMC ONLY)
Amphetamines, Ur Screen: POSITIVE — AB
Barbiturates, Ur Screen: NOT DETECTED
Benzodiazepine, Ur Scrn: POSITIVE — AB
Cannabinoid 50 Ng, Ur ~~LOC~~: NOT DETECTED
Cocaine Metabolite,Ur ~~LOC~~: POSITIVE — AB
MDMA (Ecstasy)Ur Screen: NOT DETECTED
Methadone Scn, Ur: NOT DETECTED
Opiate, Ur Screen: NOT DETECTED
Phencyclidine (PCP) Ur S: NOT DETECTED
Tricyclic, Ur Screen: NOT DETECTED

## 2020-11-25 LAB — SALICYLATE LEVEL: Salicylate Lvl: <7 mg/dL — ABNORMAL LOW (ref 7.0–30.0)

## 2020-11-25 LAB — ACETAMINOPHEN LEVEL: Acetaminophen (Tylenol), Serum: <10 ug/mL — ABNORMAL LOW (ref 10–30)

## 2020-11-25 LAB — ETHANOL: Alcohol, Ethyl (B): <10 mg/dL (ref <10)

## 2020-11-25 MED ORDER — IBUPROFEN 600 MG PO TABS
600.0000 mg | ORAL_TABLET | Freq: Once | ORAL | Status: AC
  Administered 2020-11-25: 600 mg via ORAL
  Filled 2020-11-25: qty 1

## 2020-11-25 MED ORDER — ACETAMINOPHEN 500 MG PO TABS
1000.0000 mg | ORAL_TABLET | Freq: Once | ORAL | Status: AC
  Administered 2020-11-25: 1000 mg via ORAL
  Filled 2020-11-25: qty 2
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
## 2020-11-25 NOTE — ED Notes (Signed)
Pt provided clothes to change into
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
## 2020-11-25 NOTE — ED Notes (Signed)
Pt provided phone to call for ride. 

## 2020-11-25 NOTE — Discharge Instructions (Addendum)
Please consider using some of the provided resources to find help for your substance abuse issues.  This will likely help you with anger management and avoid situations like this.  Keep your hand abrasions clean and dry and use a small amount of bacitracin antibiotic ointment on the wounds.  You may consider elevating your hand and using cold compresses to help with the swelling.

## 2020-11-25 NOTE — ED Notes (Signed)
Hourly rounding reveals patient in room. No complaints, stable, in no acute distress. Q15 minute rounds and monitoring via Rover and Officer to continue.   

## 2021-03-19 ENCOUNTER — Emergency Department
Admission: EM | Admit: 2021-03-19 | Discharge: 2021-03-19 | Disposition: A | Discharge status: Home / Self Care | Payer: Self-pay | Attending: Emergency Medicine | Admitting: Emergency Medicine

## 2021-03-19 ENCOUNTER — Other Ambulatory Visit: Payer: Self-pay

## 2021-03-19 DIAGNOSIS — Z79899 Other long term (current) drug therapy: Secondary | ICD-10-CM | POA: Insufficient documentation

## 2021-03-19 DIAGNOSIS — Y900 Blood alcohol level of less than 20 mg/100 ml: Secondary | ICD-10-CM | POA: Insufficient documentation

## 2021-03-19 DIAGNOSIS — F1523 Other stimulant dependence with withdrawal: Secondary | ICD-10-CM

## 2021-03-19 DIAGNOSIS — F17210 Nicotine dependence, cigarettes, uncomplicated: Secondary | ICD-10-CM | POA: Insufficient documentation

## 2021-03-19 DIAGNOSIS — F1593 Other stimulant use, unspecified with withdrawal: Secondary | ICD-10-CM | POA: Insufficient documentation

## 2021-03-19 LAB — URINE DRUG SCREEN, QUALITATIVE (ARMC ONLY)
Amphetamines, Ur Screen: NOT DETECTED
Barbiturates, Ur Screen: NOT DETECTED
Benzodiazepine, Ur Scrn: NOT DETECTED
Cannabinoid 50 Ng, Ur ~~LOC~~: NOT DETECTED
Cocaine Metabolite,Ur ~~LOC~~: NOT DETECTED
MDMA (Ecstasy)Ur Screen: NOT DETECTED
Methadone Scn, Ur: NOT DETECTED
Opiate, Ur Screen: NOT DETECTED
Phencyclidine (PCP) Ur S: NOT DETECTED
Tricyclic, Ur Screen: NOT DETECTED

## 2021-03-19 LAB — CBC
HCT: 41.2 % (ref 39.0–52.0)
Hemoglobin: 14.2 g/dL (ref 13.0–17.0)
MCH: 32.5 pg (ref 26.0–34.0)
MCHC: 34.5 g/dL (ref 30.0–36.0)
MCV: 94.3 fL (ref 80.0–100.0)
Platelets: 364 10*3/uL (ref 150–400)
RBC: 4.37 MIL/uL (ref 4.22–5.81)
RDW: 11.8 % (ref 11.5–15.5)
WBC: 10.3 10*3/uL (ref 4.0–10.5)
nRBC: 0 % (ref 0.0–0.2)

## 2021-03-19 LAB — TROPONIN I (HIGH SENSITIVITY): Troponin I (High Sensitivity): 8 ng/L (ref <18)

## 2021-03-19 LAB — COMPREHENSIVE METABOLIC PANEL
ALT: 63 U/L — ABNORMAL HIGH (ref 0–44)
AST: 41 U/L (ref 15–41)
Albumin: 3.9 g/dL (ref 3.5–5.0)
Alkaline Phosphatase: 59 U/L (ref 38–126)
Anion gap: 8 (ref 5–15)
BUN: 13 mg/dL (ref 6–20)
CO2: 31 mmol/L (ref 22–32)
Calcium: 8.6 mg/dL — ABNORMAL LOW (ref 8.9–10.3)
Chloride: 96 mmol/L — ABNORMAL LOW (ref 98–111)
Creatinine, Ser: 0.63 mg/dL (ref 0.61–1.24)
GFR, Estimated: >60 mL/min (ref >60)
Glucose, Bld: 88 mg/dL (ref 70–99)
Potassium: 4.3 mmol/L (ref 3.5–5.1)
Sodium: 135 mmol/L (ref 135–145)
Total Bilirubin: 0.5 mg/dL (ref 0.3–1.2)
Total Protein: 6.6 g/dL (ref 6.5–8.1)

## 2021-03-19 LAB — ETHANOL: Alcohol, Ethyl (B): <10 mg/dL (ref <10)

## 2021-03-19 MED ORDER — PROCHLORPERAZINE MALEATE 10 MG PO TABS
10.0000 mg | ORAL_TABLET | Freq: Once | ORAL | Status: AC
  Administered 2021-03-19: 10 mg via ORAL
  Filled 2021-03-19: qty 1

## 2021-03-19 MED ORDER — PROCHLORPERAZINE MALEATE 10 MG PO TABS: 10.0000 mg | ORAL_TABLET | Freq: Three times a day (TID) | ORAL | 0 refills | Status: DC | PRN

## 2021-03-19 MED ORDER — KETOROLAC TROMETHAMINE 60 MG/2ML IM SOLN
30.0000 mg | Freq: Once | INTRAMUSCULAR | Status: AC
  Administered 2021-03-19: 30 mg via INTRAMUSCULAR
  Filled 2021-03-19: qty 2

## 2021-03-19 NOTE — ED Notes (Signed)
Patient ate an ED sandwich tray and drank some water without difficulty.

## 2021-03-19 NOTE — ED Provider Notes (Signed)
[facility] Emergency Department Provider Note  ____________________________________________   I have reviewed the triage vital signs and the nursing notes.   HISTORY  Chief Complaint Meth withdrawal   History limited by: Not Limited   HPI Eric Clarke is a 30 y.o. male who presents to the emergency department today with primary concern for going through meth withdrawal. The patient states that he last used 6 days ago. Primary complaint at the time of my exam is for headache. The patient states that he went to [facility] today and they instructed him to come to the emergency department. The patient does state that he went to [facility] 4 days ago for hallucinations and withdrawal symptoms. The patient says that last night he tried drinking alcohol to help with his symptoms. Family then noticed he was not responding normally and was going purple around the lips. He was given narcan as well as CPR by girlfriend. The patient denies any opioid use.   Records reviewed. Per medical record review patient has a history of positive UDS for opioids 4 days ago.   History reviewed. No pertinent past medical history.  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  Social History Social History   Tobacco Use   Smoking status: Every Day    Packs/day: 0.25    Types: Cigarettes   Smokeless tobacco: Current    Types: Chew  Substance Use Topics   Alcohol use: Yes    Comment: occ   Drug use: Not Currently    Types: Methamphetamines    Comment: herion, percocet, saboxone    Review of Systems Constitutional: No  fever/chills Eyes: No visual changes. ENT: No sore throat. Cardiovascular: Denies chest pain. Respiratory: Denies shortness of breath. Gastrointestinal: No abdominal pain.  No nausea, no vomiting.  No diarrhea.   Genitourinary: Negative for dysuria. Musculoskeletal: Negative for back pain. Skin: Negative for rash. Neurological: Positive for headaches.   ____________________________________________   PHYSICAL EXAM:  VITAL SIGNS: ED Triage Vitals [03/19/21 1459]  Enc Vitals Group     BP (!) 151/69     Pulse Rate 81     Resp 20     Temp 98.3 F (36.8 C)     Temp Source Oral     SpO2 100 %     Weight 150 lb (68 kg)     Height 5\' 8"  (1.727 m)     Head Circumference      Peak Flow      Pain Score 8   Constitutional: Alert and oriented.  Eyes: Conjunctivae are normal.  ENT      Head: Normocephalic and atraumatic.      Nose: No congestion/rhinnorhea.      Mouth/Throat: Mucous membranes are moist.      Neck: No stridor. Hematological/Lymphatic/Immunilogical: No cervical lymphadenopathy. Cardiovascular: Normal rate, regular rhythm.  No murmurs, rubs, or gallops.  Respiratory: Normal respiratory effort without tachypnea nor retractions. Breath sounds are clear and equal bilaterally. No wheezes/rales/rhonchi. Gastrointestinal: Soft and non tender. No rebound. No guarding.  Genitourinary: Deferred Musculoskeletal: Normal range of motion in all extremities. No lower extremity edema. Neurologic:  Normal speech and language. No gross focal neurologic deficits are appreciated.  Skin:  Skin is warm, dry and intact. No rash noted. Psychiatric: Mood and affect are normal. Speech and behavior are normal. Patient exhibits appropriate insight and judgment.  ____________________________________________    LABS (pertinent positives/negatives)  CBC wbc 10.3, hgb 14.2, plt 364 CMP wnl except gl 96, ca 8.6, alt  63  ____________________________________________   EKG  None  ____________________________________________    RADIOLOGY  None  ____________________________________________   PROCEDURES  Procedures  ____________________________________________   INITIAL IMPRESSION / ASSESSMENT AND PLAN / ED COURSE  Pertinent labs & imaging results that were available during my care of the patient were reviewed by me and considered in my medical decision making (see chart for details).   Patient presented to the emergency department today because of concern for withdrawal from meth. Primary complaint of headache. He did mention an episode last night when he was drinking when family felt he went unresponsive. No clear etiology of that episode on blood work today. Troponin and other blood work without any concerning findings. Patient did feel better after medication here. Did discuss with patient possibility of a residential detox facility however he was not interested in this at this time. Will give patient prescription for compazine to help control symptoms.    ____________________________________________   FINAL CLINICAL IMPRESSION(S) / ED DIAGNOSES  Final diagnoses:  Stimulant withdrawal (HCC)     Note: This dictation was prepared with Dragon dictation. Any transcriptional errors that result from this process are unintentional     Phineas Semen, MD 03/19/21 1851
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
## 2021-03-19 NOTE — ED Notes (Signed)
Patient left with his family member that has been at bedside.

## 2021-03-19 NOTE — Discharge Instructions (Addendum)
Please seek medical attention for any high fevers, chest pain, shortness of breath, change in behavior, persistent vomiting, bloody stool or any other new or concerning symptoms.  

## 2021-03-19 NOTE — ED Notes (Signed)
Patient ambulated to and from hallway bathroom with a steady gait. 

## 2021-03-19 NOTE — ED Notes (Signed)
Patient is alert and oriented, no tremors noted. Patient answers questions appropriately. Family at bedside. Skin is warm and dry.

## 2021-04-05 ENCOUNTER — Other Ambulatory Visit: Payer: Self-pay

## 2021-04-05 ENCOUNTER — Emergency Department
Admission: EM | Admit: 2021-04-05 | Discharge: 2021-04-06 | Disposition: A | Discharge status: Home / Self Care | Payer: Self-pay | Attending: Emergency Medicine | Admitting: Emergency Medicine

## 2021-04-05 DIAGNOSIS — Z20822 Contact with and (suspected) exposure to covid-19: Secondary | ICD-10-CM | POA: Insufficient documentation

## 2021-04-05 DIAGNOSIS — F15951 Other stimulant use, unspecified with stimulant-induced psychotic disorder with hallucinations: Secondary | ICD-10-CM | POA: Diagnosis present

## 2021-04-05 DIAGNOSIS — Z79899 Other long term (current) drug therapy: Secondary | ICD-10-CM | POA: Insufficient documentation

## 2021-04-05 DIAGNOSIS — F1920 Other psychoactive substance dependence, uncomplicated: Secondary | ICD-10-CM | POA: Diagnosis present

## 2021-04-05 DIAGNOSIS — F1910 Other psychoactive substance abuse, uncomplicated: Secondary | ICD-10-CM

## 2021-04-05 DIAGNOSIS — F1510 Other stimulant abuse, uncomplicated: Secondary | ICD-10-CM | POA: Diagnosis present

## 2021-04-05 DIAGNOSIS — F17210 Nicotine dependence, cigarettes, uncomplicated: Secondary | ICD-10-CM | POA: Insufficient documentation

## 2021-04-05 DIAGNOSIS — F15251 Other stimulant dependence with stimulant-induced psychotic disorder with hallucinations: Secondary | ICD-10-CM | POA: Insufficient documentation

## 2021-04-05 DIAGNOSIS — R440 Auditory hallucinations: Secondary | ICD-10-CM

## 2021-04-05 LAB — CBC
HCT: 43 % (ref 39.0–52.0)
Hemoglobin: 15.2 g/dL (ref 13.0–17.0)
MCH: 33 pg (ref 26.0–34.0)
MCHC: 35.3 g/dL (ref 30.0–36.0)
MCV: 93.3 fL (ref 80.0–100.0)
Platelets: 467 10*3/uL — ABNORMAL HIGH (ref 150–400)
RBC: 4.61 MIL/uL (ref 4.22–5.81)
RDW: 12 % (ref 11.5–15.5)
WBC: 10.4 10*3/uL (ref 4.0–10.5)
nRBC: 0 % (ref 0.0–0.2)

## 2021-04-05 LAB — ETHANOL: Alcohol, Ethyl (B): <10 mg/dL (ref <10)

## 2021-04-05 LAB — ACETAMINOPHEN LEVEL: Acetaminophen (Tylenol), Serum: <10 ug/mL — ABNORMAL LOW (ref 10–30)

## 2021-04-05 LAB — COMPREHENSIVE METABOLIC PANEL
ALT: 29 U/L (ref 0–44)
AST: 22 U/L (ref 15–41)
Albumin: 4.3 g/dL (ref 3.5–5.0)
Alkaline Phosphatase: 61 U/L (ref 38–126)
Anion gap: 3 — ABNORMAL LOW (ref 5–15)
BUN: 12 mg/dL (ref 6–20)
CO2: 28 mmol/L (ref 22–32)
Calcium: 8.7 mg/dL — ABNORMAL LOW (ref 8.9–10.3)
Chloride: 105 mmol/L (ref 98–111)
Creatinine, Ser: 0.84 mg/dL (ref 0.61–1.24)
GFR, Estimated: >60 mL/min (ref >60)
Glucose, Bld: 153 mg/dL — ABNORMAL HIGH (ref 70–99)
Potassium: 3.8 mmol/L (ref 3.5–5.1)
Sodium: 136 mmol/L (ref 135–145)
Total Bilirubin: 0.7 mg/dL (ref 0.3–1.2)
Total Protein: 7.1 g/dL (ref 6.5–8.1)

## 2021-04-05 LAB — SALICYLATE LEVEL: Salicylate Lvl: <7 mg/dL — ABNORMAL LOW (ref 7.0–30.0)

## 2021-04-05 MED ORDER — OLANZAPINE 10 MG PO TABS
10.0000 mg | ORAL_TABLET | Freq: Every day | ORAL | Status: DC
  Administered 2021-04-05: 10 mg via ORAL
  Filled 2021-04-05 (×2): qty 1

## 2021-04-05 MED ORDER — LORAZEPAM 1 MG PO TABS
1.0000 mg | ORAL_TABLET | Freq: Once | ORAL | Status: AC
  Administered 2021-04-05: 1 mg via ORAL
  Filled 2021-04-05: qty 1

## 2021-04-05 MED ORDER — ACETAMINOPHEN 500 MG PO TABS
1000.0000 mg | ORAL_TABLET | Freq: Once | ORAL | Status: AC
  Administered 2021-04-05: 1000 mg via ORAL
  Filled 2021-04-05: qty 2

## 2021-04-05 MED ORDER — NAPROXEN 500 MG PO TABS
500.0000 mg | ORAL_TABLET | Freq: Once | ORAL | Status: AC
  Administered 2021-04-05: 500 mg via ORAL
  Filled 2021-04-05: qty 1

## 2021-04-05 MED ORDER — HALOPERIDOL 5 MG PO TABS: 5.0000 mg | ORAL_TABLET | Freq: Four times a day (QID) | ORAL | Status: DC | PRN

## 2021-04-05 NOTE — ED Notes (Signed)
Gave food tray with juice. 

## 2021-04-05 NOTE — ED Notes (Signed)
MD at bedside. 
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
## 2021-04-05 NOTE — ED Notes (Signed)
Pt reporting that someone in room is saying "[place]" making fun of him.  No noises heard from this RN

## 2021-04-05 NOTE — ED Notes (Signed)
While dressing out, pt tried to put can of dip into scrubs, asked to put into bag as already requested. Placed into bag

## 2021-04-05 NOTE — ED Notes (Signed)
Report received from Kelly, RN. Patient currently sleeping, respirations regular and unlabored. Q15 minute rounds and observation by Rover and Officer to continue. Will assess patient once awake.  

## 2021-04-05 NOTE — ED Notes (Addendum)
Pt dressed out with this RN, Eman NT. Belongings include: Green Electrical engineer Cell phone Belt Yellow boxers [place] in Transport planner

## 2021-04-06 LAB — URINE DRUG SCREEN, QUALITATIVE (ARMC ONLY)
Amphetamines, Ur Screen: POSITIVE — AB
Barbiturates, Ur Screen: NOT DETECTED
Benzodiazepine, Ur Scrn: POSITIVE — AB
Cannabinoid 50 Ng, Ur ~~LOC~~: POSITIVE — AB
Cocaine Metabolite,Ur ~~LOC~~: NOT DETECTED
MDMA (Ecstasy)Ur Screen: NOT DETECTED
Methadone Scn, Ur: NOT DETECTED
Opiate, Ur Screen: NOT DETECTED
Phencyclidine (PCP) Ur S: NOT DETECTED
Tricyclic, Ur Screen: NOT DETECTED

## 2021-04-06 LAB — RESP PANEL BY RT-PCR (FLU A&B, COVID) ARPGX2
Influenza A by PCR: NEGATIVE
Influenza B by PCR: NEGATIVE
SARS Coronavirus 2 by RT PCR: NEGATIVE

## 2021-04-06 NOTE — ED Provider Notes (Signed)
Emergency Medicine Observation Re-evaluation Note  Eric Clarke is a 30 y.o. male, seen on rounds today.  Pt initially presented to the ED for complaints of Hallucinations  Currently, the patient is calm, no acute complaints.  Physical Exam  Blood pressure 126/77, pulse 84, temperature 97.9 F (36.6 C), temperature source Tympanic, resp. rate 18, height 5\' 8"  (1.727 m), weight 68 kg, SpO2 98 %. Physical Exam General: NAD Lungs: CTAB Psych: not agitated  ED Course / MDM  EKG:    I have reviewed the labs performed to date as well as medications administered while in observation.  Recent changes in the last 24 hours include no acute events overnight.  Reevaluated by psychiatry today, they find the patient is not interested and psychiatric hospitalization, feeling better, wishes to pursue outpatient treatment.  Plan  Current plan is for discharge, referral to outpatient substance abuse treatment. Patient is not under full IVC at this time.   , MD 04/06/21 1048

## 2021-04-06 NOTE — ED Notes (Signed)
Meal given to pt.
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
## 2021-04-06 NOTE — ED Notes (Signed)
Pt lying in bed with eyes closed; easily aroused when name called. Pt states "I'm okay; I'm just wanting to go home." Pt denies pain, SI/HI/AVH, anxiety and depression at this time. Pt reports that he slept "pretty good" last night and describes his appetite as "good". Pt reports last BM was "last night". No acute distress noted.

## 2021-04-06 NOTE — ED Notes (Signed)
Voluntary pending AM reassessment, meth psychoses

## 2021-04-06 NOTE — ED Notes (Signed)
Pt signed physical discharge form and verbalized understanding discharge instructions and follow ups. This RN assisted pt to lobby to await ride home.

## 2021-07-09 ENCOUNTER — Emergency Department
Admission: EM | Admit: 2021-07-09 | Discharge: 2021-07-09 | Disposition: A | Discharge status: Home / Self Care | Payer: Self-pay | Attending: Emergency Medicine | Admitting: Emergency Medicine

## 2021-07-09 ENCOUNTER — Other Ambulatory Visit: Payer: Self-pay

## 2021-07-09 ENCOUNTER — Emergency Department: Payer: Self-pay

## 2021-07-09 DIAGNOSIS — F17210 Nicotine dependence, cigarettes, uncomplicated: Secondary | ICD-10-CM | POA: Insufficient documentation

## 2021-07-09 DIAGNOSIS — T401X1A Poisoning by heroin, accidental (unintentional), initial encounter: Secondary | ICD-10-CM | POA: Insufficient documentation

## 2021-07-09 MED ORDER — NALOXONE HCL 4 MG/0.1ML NA LIQD
1.0000 | Freq: Once | NASAL | Status: AC
  Administered 2021-07-09: 1 via NASAL
  Filled 2021-07-09: qty 4

## 2021-07-09 NOTE — ED Notes (Signed)
Pt called ride for discharge.

## 2021-07-09 NOTE — ED Triage Notes (Signed)
Pt was at his residence breathing 4-6 times a minute after using heroin. EMS placed IV and gave 2 mg IV narcan. Pt is now alert and oriented.

## 2021-07-09 NOTE — ED Notes (Signed)
Pt given ginger ale.

## 2021-07-09 NOTE — ED Notes (Signed)
Pt sleeping at this time, O2 remains at 98% on room air.

## 2021-07-09 NOTE — ED Provider Notes (Signed)
[facility] Emergency Department Provider Note  ____________________________________________  Time seen: Approximately 6:57 PM  I have reviewed the triage vital signs and the nursing notes.   HISTORY  Chief Complaint Drug Overdose    HPI Eric Clarke is a 30 y.o. male with a past history of drug abuse who is brought to the ED due to heroin overdose.  Patient reports that he injected heroin earlier today.  EMS was called to his residence and found him unconscious, unarousable, with a respiratory rate of 4.  They gave 2 mg of IV Narcan with return of normal mental status and improved breathing.  Patient denies any chest pain or shortness of breath, no fever or chills, no arm pain at his injection site.  He is feeling normal currently.  Denies any recent illness or trauma.   History reviewed. No pertinent past medical history.   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 Authorizing Provider  prochlorperazine (COMPAZINE) 10 MG tablet Take 1 tablet (10 mg total) by mouth every 8 (eight) hours as needed for vomiting (headache). 03/19/21   Nance Pear, MD     Allergies Doxycycline   History reviewed. No pertinent family history.  Social History Social History   Tobacco Use   Smoking status: Every Day    Packs/day: 0.25    Types: Cigarettes   Smokeless tobacco: Current    Types: Chew  Vaping Use   Vaping Use: Never used  Substance Use Topics   Alcohol use: Yes    Comment: occ   Drug use: Not Currently    Types: Methamphetamines, Marijuana, IV    Comment: herion, percocet, saboxone    Review of  Systems  Constitutional:   No fever or chills.  ENT:   No sore throat. No rhinorrhea. Cardiovascular:   No chest pain or syncope. Respiratory:   No dyspnea or cough. Gastrointestinal:   Negative for abdominal pain, vomiting and diarrhea.  Musculoskeletal:   Negative for focal pain or swelling All other systems reviewed and are negative except as documented above in ROS and HPI.  ____________________________________________   PHYSICAL EXAM:  VITAL SIGNS: ED Triage Vitals  Enc Vitals Group     BP      Pulse      Resp      Temp      Temp src      SpO2      Weight      Height      Head Circumference      Peak Flow      Pain Score      Pain Loc      Pain Edu?      Excl. in [place]?     Vital signs reviewed, nursing assessments reviewed.   Constitutional:   Alert and oriented. Non-toxic appearance. Eyes:   Conjunctivae are normal. EOMI. PERRL. ENT      Head:   Normocephalic with small scalp contusion on the left occipital parietal scalp.  No laceration, no bony point tenderness or depression..      Nose:   Normal      Mouth/Throat:   Normal, moist  mucosa.      Neck:   No meningismus. Full ROM. Hematological/Lymphatic/Immunilogical:   No cervical lymphadenopathy. Cardiovascular:   RRR. Symmetric bilateral radial and DP pulses.  No murmurs. Cap refill less than 2 seconds. Respiratory:   Normal respiratory effort without tachypnea/retractions. Breath sounds are clear and equal bilaterally. No wheezes/rales/rhonchi. Gastrointestinal:   Soft and nontender. Non distended. There is no CVA tenderness.  No rebound, rigidity, or guarding. Genitourinary:   deferred Musculoskeletal:   Normal range of motion in all extremities. No joint effusions.  No lower extremity tenderness.  No edema.  No inflammatory skin changes Neurologic:   Normal speech and language.  Motor grossly intact. No acute focal neurologic deficits are appreciated.  Skin:    Skin is warm, dry and intact. No rash  noted.  No petechiae, purpura, or bullae.  Track marks to the left antecubital fossa.  ____________________________________________    LABS (pertinent positives/negatives) (all labs ordered are listed, but only abnormal results are displayed) Labs Reviewed - No data to display ____________________________________________   EKG    ____________________________________________    RADIOLOGY  DG Chest Portable 1 View  Result Date: 07/09/2021 CLINICAL DATA:  Overdose. EXAM: PORTABLE CHEST 1 VIEW COMPARISON:  Chest radiograph dated 04/18/2020. FINDINGS: The heart size and mediastinal contours are within normal limits. Both lungs are clear. The visualized skeletal structures are unremarkable. IMPRESSION: No active disease. Electronically Signed   By: Elgie Collard M.D.   On: 07/09/2021 19:41    ____________________________________________   PROCEDURES Procedures  ____________________________________________    CLINICAL IMPRESSION / ASSESSMENT AND PLAN / ED COURSE  Medications ordered in the ED: Medications  naloxone (NARCAN) nasal spray 4 mg/0.1 mL (has no administration in time range)    Pertinent labs & imaging results that were available during my care of the patient were reviewed by me and considered in my medical decision making (see chart for details).  Eric Clarke was evaluated in Emergency Department on 07/09/2021 for the symptoms described in the history of present illness. He was evaluated in the context of the global COVID-19 pandemic, which necessitated consideration that the patient might be at risk for infection with the SARS-CoV-2 virus that causes COVID-19. Institutional protocols and algorithms that pertain to the evaluation of patients at risk for COVID-19 are in a state of rapid change based on information released by regulatory bodies including the CDC and federal and state organizations. These policies and algorithms were followed during the patient's  care in the ED.   Patient brought to the ED due to accidental IV heroin overdose.  He was obtunded and breathing 4 times a minute for EMS so they gave 2 mg IV Narcan with good improvement.  Patient is awake and alert oriented, denies any acute complaints, no chest pain or shortness of breath.  Will observe in ED to ensure no relapsing mental/respiratory depression as Narcan wears off.   ----------------------------------------- 9:10 PM on 07/09/2021 ----------------------------------------- Patient sleeping comfortable with normal respiratory rate.  Easily arousable by voice.  No chest pain or shortness of breath.  Continues to feel well.  Chest x-ray is unremarkable.  Stable for discharge.  Will give the patient a home Narcan autoinjector for emergency use.     ____________________________________________   FINAL CLINICAL IMPRESSION(S) / ED DIAGNOSES    Final diagnoses:  Accidental overdose of heroin, initial encounter [facility])     ED Discharge Orders     None       Portions of this note  were generated with dragon dictation software. Dictation errors may occur despite best attempts at proofreading.    Carrie Mew, MD 07/09/21 2110

## 2021-10-04 ENCOUNTER — Emergency Department: Payer: Self-pay

## 2021-10-04 ENCOUNTER — Emergency Department
Admission: EM | Admit: 2021-10-04 | Discharge: 2021-10-04 | Discharge status: Against Medical Advice | Payer: Self-pay | Attending: Emergency Medicine | Admitting: Emergency Medicine

## 2021-10-04 ENCOUNTER — Other Ambulatory Visit: Payer: Self-pay

## 2021-10-04 ENCOUNTER — Encounter: Payer: Self-pay | Admitting: Emergency Medicine

## 2021-10-04 DIAGNOSIS — R0602 Shortness of breath: Secondary | ICD-10-CM | POA: Insufficient documentation

## 2021-10-04 LAB — BASIC METABOLIC PANEL
Anion gap: 6 (ref 5–15)
BUN: 10 mg/dL (ref 6–20)
CO2: 28 mmol/L (ref 22–32)
Calcium: 9 mg/dL (ref 8.9–10.3)
Chloride: 105 mmol/L (ref 98–111)
Creatinine, Ser: 0.82 mg/dL (ref 0.61–1.24)
GFR, Estimated: >60 mL/min (ref >60)
Glucose, Bld: 86 mg/dL (ref 70–99)
Potassium: 4.3 mmol/L (ref 3.5–5.1)
Sodium: 139 mmol/L (ref 135–145)

## 2021-10-04 LAB — CBC
HCT: 45.7 % (ref 39.0–52.0)
Hemoglobin: 15.3 g/dL (ref 13.0–17.0)
MCH: 30.7 pg (ref 26.0–34.0)
MCHC: 33.5 g/dL (ref 30.0–36.0)
MCV: 91.6 fL (ref 80.0–100.0)
Platelets: 446 10*3/uL — ABNORMAL HIGH (ref 150–400)
RBC: 4.99 MIL/uL (ref 4.22–5.81)
RDW: 11.9 % (ref 11.5–15.5)
WBC: 5.3 10*3/uL (ref 4.0–10.5)
nRBC: 0 % (ref 0.0–0.2)

## 2021-10-04 LAB — TROPONIN I (HIGH SENSITIVITY): Troponin I (High Sensitivity): <2 ng/L (ref <18)

## 2021-10-04 MED ORDER — IPRATROPIUM-ALBUTEROL 0.5-2.5 (3) MG/3ML IN SOLN
3.0000 mL | Freq: Once | RESPIRATORY_TRACT | Status: DC
  Filled 2021-10-04: qty 3

## 2021-10-04 NOTE — ED Triage Notes (Signed)
Pt via POV from home. Pt c/o mid sternal non-radiating CP and SOB, states he is unable to catch his breath and unable to lay flat. Pt is speaking in complete sentences. Denies hx of asthma. States that this started last night. Also endorses productive cough. Denies fever. Pt is A&OX4 and NAD ?

## 2021-10-04 NOTE — ED Notes (Signed)
First Nurse Note:  Pt to ED via POV stating that he can't catch his breath. Pt states that he is unable to lay down. Pt is speaking in complete sentences, respirations are equal and unlabored. Pt is in NAD. ?

## 2021-10-04 NOTE — ED Provider Notes (Signed)
? ?[facility] ?Provider Note ? ? ? Event Date/Time  ? First MD Initiated Contact with Patient 10/04/21 [phone]   ?  (approximate) ? ? ?History  ? ?Chest Pain and Shortness of Breath ? ? ?HPI ? ?Eric Clarke is a 31 y.o. male with history of substance abuse, including amphetamine abuse and opioid abuse who presents with complaints of chest discomfort and shortness of breath.  Patient reports last night he felt short of breath while lying down flat, he feels better when he is sitting up.  He denies chest pain at this time.  No fevers or chills.  No calf pain or swelling ?  ? ? ?Physical Exam  ? ?Triage Vital Signs: ?ED Triage Vitals  ?Enc Vitals Group  ?   BP 10/04/21 0930 (!) 144/101  ?   Pulse Rate 10/04/21 0930 77  ?   Resp 10/04/21 0930 18  ?   Temp 10/04/21 0930 97.8 ?F (36.6 ?C)  ?   Temp Source 10/04/21 0930 Oral  ?   SpO2 10/04/21 0930 100 %  ?   Weight 10/04/21 0927 68 kg (150 lb)  ?   Height 10/04/21 0927 1.727 m (5\' 8" )  ?   Head Circumference --   ?   Peak Flow --   ?   Pain Score 10/04/21 0927 8  ?   Pain Loc --   ?   Pain Edu? --   ?   Excl. in [place]? --   ? ? ?Most recent vital signs: ?Vitals:  ? 10/04/21 0930  ?BP: (!) 144/101  ?Pulse: 77  ?Resp: 18  ?Temp: 97.8 ?F (36.6 ?C)  ?SpO2: 100%  ? ? ? ?General: Awake, no distress.  ?CV:  Good peripheral perfusion.  Regular rate and rhythm, no murmurs ?Resp:  Normal effort.  CTA B ?Abd:  No distention.  ?Other:  No calf pain or swelling ? ? ?ED Results / Procedures / Treatments  ? ?Labs ?(all labs ordered are listed, but only abnormal results are displayed) ?Labs Reviewed  ?CBC - Abnormal; Notable for the following components:  ?    Result Value  ? Platelets 446 (*)   ? All other components within normal limits  ?BASIC METABOLIC PANEL  ?TROPONIN I (HIGH SENSITIVITY)  ?TROPONIN I (HIGH SENSITIVITY)  ? ? ? ?EKG ? ?ED ECG REPORT ?I, Lavonia Drafts, the attending physician, personally viewed and interpreted this ECG. ? ?Date:  10/04/2021 ? ?Rhythm: normal sinus rhythm ?QRS Axis: normal ?Intervals: normal ?ST/T Wave abnormalities: normal ?Narrative Interpretation: no evidence of acute ischemia ? ? ? ?RADIOLOGY ?Chest x-ray viewed and interpreted by me, no acute abnormality ? ? ? ?PROCEDURES: ? ?Critical Care performed:  ? ?Procedures ? ? ?MEDICATIONS ORDERED IN ED: ?Medications  ?ipratropium-albuterol (DUONEB) 0.5-2.5 (3) MG/3ML nebulizer solution 3 mL (has no administration in time range)  ? ? ? ?IMPRESSION / MDM / ASSESSMENT AND PLAN / ED COURSE  ?I reviewed the triage vital signs and the nursing notes. ? ? ? ?Patient presents with shortness of breath as detailed above, he appears quite comfortable on exam, he reports this seems to worsen when he lies down.  Differential includes bronchospasm, edema/pleural effusion, pneumonia ? ?We will send for chest x-ray, obtain labs and treat with a DuoNeb to reevaluate ? ?Discussed x-ray results with the patient. ? ?When I went back to reevaluate the patient he had apparently eloped for unknown reasons, he did have decisional capacity ? ? ? ?  ? ? ?  FINAL CLINICAL IMPRESSION(S) / ED DIAGNOSES  ? ?Final diagnoses:  ?Shortness of breath  ? ? ? ?Rx / DC Orders  ? ?ED Discharge Orders   ? ? None  ? ?  ? ? ? ?Note:  This document was prepared using Dragon voice recognition software and may include unintentional dictation errors. ?  ?Lavonia Drafts, MD ?10/04/21 1105 ? ?

## 2022-02-23 ENCOUNTER — Emergency Department: Payer: Self-pay

## 2022-02-23 ENCOUNTER — Other Ambulatory Visit: Payer: Self-pay

## 2022-02-23 ENCOUNTER — Encounter: Payer: Self-pay | Admitting: *Deleted

## 2022-02-23 ENCOUNTER — Emergency Department
Admission: EM | Admit: 2022-02-23 | Discharge: 2022-02-24 | Disposition: A | Discharge status: Home / Self Care | Payer: Self-pay | Attending: Emergency Medicine | Admitting: Emergency Medicine

## 2022-02-23 DIAGNOSIS — I6381 Other cerebral infarction due to occlusion or stenosis of small artery: Secondary | ICD-10-CM

## 2022-02-23 DIAGNOSIS — D72829 Elevated white blood cell count, unspecified: Secondary | ICD-10-CM | POA: Insufficient documentation

## 2022-02-23 DIAGNOSIS — Z79899 Other long term (current) drug therapy: Secondary | ICD-10-CM | POA: Insufficient documentation

## 2022-02-23 DIAGNOSIS — I6389 Other cerebral infarction: Secondary | ICD-10-CM

## 2022-02-23 DIAGNOSIS — R200 Anesthesia of skin: Secondary | ICD-10-CM | POA: Insufficient documentation

## 2022-02-23 LAB — BASIC METABOLIC PANEL
Anion gap: 8 (ref 5–15)
BUN: 10 mg/dL (ref 6–20)
CO2: 25 mmol/L (ref 22–32)
Calcium: 8.2 mg/dL — ABNORMAL LOW (ref 8.9–10.3)
Chloride: 101 mmol/L (ref 98–111)
Creatinine, Ser: 1.04 mg/dL (ref 0.61–1.24)
GFR, Estimated: >60 mL/min (ref >60)
Glucose, Bld: 105 mg/dL — ABNORMAL HIGH (ref 70–99)
Potassium: 4 mmol/L (ref 3.5–5.1)
Sodium: 134 mmol/L — ABNORMAL LOW (ref 135–145)

## 2022-02-23 LAB — CBC
HCT: 38.8 % — ABNORMAL LOW (ref 39.0–52.0)
Hemoglobin: 13.2 g/dL (ref 13.0–17.0)
MCH: 30.7 pg (ref 26.0–34.0)
MCHC: 34 g/dL (ref 30.0–36.0)
MCV: 90.2 fL (ref 80.0–100.0)
Platelets: 279 10*3/uL (ref 150–400)
RBC: 4.3 MIL/uL (ref 4.22–5.81)
RDW: 11.9 % (ref 11.5–15.5)
WBC: 21.8 10*3/uL — ABNORMAL HIGH (ref 4.0–10.5)
nRBC: 0 % (ref 0.0–0.2)

## 2022-02-23 LAB — URINE DRUG SCREEN, QUALITATIVE (ARMC ONLY)
Amphetamines, Ur Screen: POSITIVE — AB
Barbiturates, Ur Screen: NOT DETECTED
Benzodiazepine, Ur Scrn: NOT DETECTED
Cannabinoid 50 Ng, Ur ~~LOC~~: NOT DETECTED
Cocaine Metabolite,Ur ~~LOC~~: NOT DETECTED
MDMA (Ecstasy)Ur Screen: NOT DETECTED
Methadone Scn, Ur: NOT DETECTED
Opiate, Ur Screen: NOT DETECTED
Phencyclidine (PCP) Ur S: NOT DETECTED
Tricyclic, Ur Screen: NOT DETECTED

## 2022-02-23 LAB — LACTIC ACID, PLASMA: Lactic Acid, Venous: 1.9 mmol/L (ref 0.5–1.9)

## 2022-02-23 LAB — TROPONIN I (HIGH SENSITIVITY)
Troponin I (High Sensitivity): 7 ng/L (ref <18)
Troponin I (High Sensitivity): 6 ng/L (ref <18)

## 2022-02-23 MED ORDER — SODIUM CHLORIDE 0.9 % IV BOLUS
1000.0000 mL | Freq: Once | INTRAVENOUS | Status: AC
  Administered 2022-02-23: 1000 mL via INTRAVENOUS

## 2022-02-23 MED ORDER — GADOBUTROL 1 MMOL/ML IV SOLN
6.0000 mL | Freq: Once | INTRAVENOUS | Status: AC | PRN
  Administered 2022-02-24: 6 mL via INTRAVENOUS
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
## 2022-02-23 NOTE — ED Notes (Signed)
Pt provided water and sandwich tray after passing swallow screen - Ok'D by EDP.   Pt talking to MRI.

## 2022-02-23 NOTE — ED Notes (Signed)
Pt to MRI

## 2022-02-23 NOTE — ED Provider Notes (Signed)
[facility] Provider Note    Event Date/Time   First MD Initiated Contact with Patient 02/23/22 2143     (approximate)   History   Numbness   HPI  Eric Clarke is a 31 y.o. male who presents to the emergency department today because of concerns for numbness to the left side of his face and left leg.  Patient states he noticed the symptoms this morning and that they have been persisting constant throughout the day.  Patient does have history of IV drug use.  Recently overdosed and had Narcan administered to him.  He is unsure if this is related.  Additionally has complaints of an abscess that is ruptured in his right armpit.      Physical Exam   Triage Vital Signs: ED Triage Vitals  Enc Vitals Group     BP 02/23/22 1842 115/72     Pulse Rate 02/23/22 1842 78     Resp 02/23/22 1842 14     Temp 02/23/22 1842 98.4 F (36.9 C)     Temp Source 02/23/22 1842 Oral     SpO2 02/23/22 1842 96 %     Weight 02/23/22 1846 149 lb 14.6 oz (68 kg)     Height 02/23/22 1846 5\' 8"  (1.727 m)     Head Circumference --      Peak Flow --      Pain Score 02/23/22 1846 0     Pain Loc --      Pain Edu? --      Excl. in [place]? --     Most recent vital signs: Vitals:   02/23/22 1842 02/23/22 2126  BP: 115/72 131/86  Pulse: 78 64  Resp: 14 16  Temp: 98.4 F (36.9 C) 98 F (36.7 C)  SpO2: 96% 100%   General: Awake, alert, oriented. CV:  Good peripheral perfusion. Regular rate and rhythm. Resp:  Normal effort. Lungs clear. Abd:  No distention.  Neuro:  Subjective numbness to left face and left leg.    ED Results / Procedures / Treatments   Labs (all labs ordered are listed, but only abnormal results are displayed) Labs Reviewed  BASIC METABOLIC PANEL - Abnormal; Notable for the following components:      Result Value   Sodium 134 (*)    Glucose, Bld 105 (*)    Calcium 8.2 (*)    All other components within normal limits  CBC - Abnormal; Notable for  the following components:   WBC 21.8 (*)    HCT 38.8 (*)    All other components within normal limits  URINE DRUG SCREEN, QUALITATIVE ([facility] ONLY) - Abnormal; Notable for the following components:   Amphetamines, Ur Screen POSITIVE (*)    All other components within normal limits  TROPONIN I (HIGH SENSITIVITY)  TROPONIN I (HIGH SENSITIVITY)     EKG  I, 2127, attending physician, personally viewed and interpreted this EKG  EKG Time: 1845 Rate: 85 Rhythm: normal sinus rhythm Axis: normal Intervals: qtc 445 QRS: incomplete right bundle branch block ST changes: no st elevation Impression: abnormal ekg   RADIOLOGY I independently interpreted and visualized the CT head. My interpretation: No bleed Radiology interpretation:  IMPRESSION:  1. No acute intracranial abnormality.  2. Mild paranasal sinus disease.      PROCEDURES:  Critical Care performed: No  Procedures   MEDICATIONS ORDERED IN ED: Medications - No data to display   IMPRESSION / MDM / ASSESSMENT AND PLAN /  ED COURSE  I reviewed the triage vital signs and the nursing notes.                              Differential diagnosis includes, but is not limited to, electrolyte abnormality, stroke, spinal abscess.  Patient's presentation is most consistent with acute presentation with potential threat to life or bodily function.  Patient presented to the emergency department today because of concerns for left facial numbness and left leg numbness.  Patient without any weakness noted on exam.  Patient does have history of IV drug use.  CT scan without any concerning abnormalities.  However given significant leukocytosis I do concern for possible spinal abscess or intracranial thrombus.  Will plan on obtaining MRIs.  FINAL CLINICAL IMPRESSION(S) / ED DIAGNOSES   Left facial numbness Left leg numbness    Note:  This document was prepared using Dragon voice recognition software and may include  unintentional dictation errors.    Phineas Semen, MD 02/23/22 [phone]

## 2022-02-23 NOTE — ED Triage Notes (Signed)
First Nurse Note:  Pt via EMS from home. Pt c/o heat stroke and numbness. Pt c/o L foot numbness and L sided neck pain. Pt is IV drug user, last use was yesterday. Pt is A&Ox4 and NAD.    125/75 101 CBG  98% on RA 75 HR

## 2022-02-24 NOTE — ED Notes (Signed)
E-signature pad unavailable - Pt verbalized understanding of D/C information - no additional concerns at this time.  
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
## 2022-02-24 NOTE — ED Notes (Signed)
Pt back from MRI 

## 2022-02-24 NOTE — Discharge Instructions (Signed)
As we discussed, the MRI of your brain showed what looks like an old bleeding stroke.  Uncertain if this is related to your numbness symptoms on the left side, but certainly could be.  We recommended you stay in the hospital, but you refused and preferred to go home.  Please follow-up with Dr. Malvin Johns in the clinic as soon as possible.  He is a IT sales professional.  If you have any further weakness, numbness or strokelike symptoms and please return to the ED immediately.  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

## 2022-06-12 ENCOUNTER — Other Ambulatory Visit: Payer: Self-pay

## 2022-06-12 ENCOUNTER — Emergency Department: Payer: Self-pay

## 2022-06-12 ENCOUNTER — Encounter: Payer: Self-pay | Admitting: Emergency Medicine

## 2022-06-12 ENCOUNTER — Inpatient Hospital Stay
Admission: EM | Admit: 2022-06-12 | Discharge: 2022-06-13 | DRG: 917 | Disposition: A | Discharge status: Home / Self Care | Payer: Self-pay | Attending: Internal Medicine | Admitting: Internal Medicine

## 2022-06-12 DIAGNOSIS — J9601 Acute respiratory failure with hypoxia: Secondary | ICD-10-CM | POA: Diagnosis present

## 2022-06-12 DIAGNOSIS — F1520 Other stimulant dependence, uncomplicated: Secondary | ICD-10-CM | POA: Diagnosis present

## 2022-06-12 DIAGNOSIS — F1920 Other psychoactive substance dependence, uncomplicated: Secondary | ICD-10-CM | POA: Diagnosis present

## 2022-06-12 DIAGNOSIS — F17210 Nicotine dependence, cigarettes, uncomplicated: Secondary | ICD-10-CM | POA: Diagnosis present

## 2022-06-12 DIAGNOSIS — Z881 Allergy status to other antibiotic agents status: Secondary | ICD-10-CM

## 2022-06-12 DIAGNOSIS — Z5329 Procedure and treatment not carried out because of patient's decision for other reasons: Secondary | ICD-10-CM | POA: Diagnosis not present

## 2022-06-12 DIAGNOSIS — Z1152 Encounter for screening for COVID-19: Secondary | ICD-10-CM

## 2022-06-12 DIAGNOSIS — F1120 Opioid dependence, uncomplicated: Secondary | ICD-10-CM | POA: Diagnosis present

## 2022-06-12 DIAGNOSIS — T402X1A Poisoning by other opioids, accidental (unintentional), initial encounter: Principal | ICD-10-CM | POA: Diagnosis present

## 2022-06-12 DIAGNOSIS — F1510 Other stimulant abuse, uncomplicated: Secondary | ICD-10-CM | POA: Diagnosis present

## 2022-06-12 DIAGNOSIS — T401X1A Poisoning by heroin, accidental (unintentional), initial encounter: Secondary | ICD-10-CM | POA: Diagnosis present

## 2022-06-12 HISTORY — DX: Other psychoactive substance abuse, uncomplicated: F19.10

## 2022-06-12 LAB — CBC WITH DIFFERENTIAL/PLATELET
Abs Immature Granulocytes: 0.24 10*3/uL — ABNORMAL HIGH (ref 0.00–0.07)
Basophils Absolute: 0.1 10*3/uL (ref 0.0–0.1)
Basophils Relative: 0 %
Eosinophils Absolute: 0.5 10*3/uL (ref 0.0–0.5)
Eosinophils Relative: 4 %
HCT: 40.8 % (ref 39.0–52.0)
Hemoglobin: 13.9 g/dL (ref 13.0–17.0)
Immature Granulocytes: 2 %
Lymphocytes Relative: 15 %
Lymphs Abs: 2.2 10*3/uL (ref 0.7–4.0)
MCH: 31.8 pg (ref 26.0–34.0)
MCHC: 34.1 g/dL (ref 30.0–36.0)
MCV: 93.4 fL (ref 80.0–100.0)
Monocytes Absolute: 0.6 10*3/uL (ref 0.1–1.0)
Monocytes Relative: 4 %
Neutro Abs: 10.7 10*3/uL — ABNORMAL HIGH (ref 1.7–7.7)
Neutrophils Relative %: 75 %
Platelets: 364 10*3/uL (ref 150–400)
RBC: 4.37 MIL/uL (ref 4.22–5.81)
RDW: 11.7 % (ref 11.5–15.5)
WBC: 14.3 10*3/uL — ABNORMAL HIGH (ref 4.0–10.5)
nRBC: 0 % (ref 0.0–0.2)

## 2022-06-12 LAB — COMPREHENSIVE METABOLIC PANEL
ALT: 67 U/L — ABNORMAL HIGH (ref 0–44)
AST: 48 U/L — ABNORMAL HIGH (ref 15–41)
Albumin: 4 g/dL (ref 3.5–5.0)
Alkaline Phosphatase: 57 U/L (ref 38–126)
Anion gap: 8 (ref 5–15)
BUN: 18 mg/dL (ref 6–20)
CO2: 30 mmol/L (ref 22–32)
Calcium: 8.7 mg/dL — ABNORMAL LOW (ref 8.9–10.3)
Chloride: 104 mmol/L (ref 98–111)
Creatinine, Ser: 1.09 mg/dL (ref 0.61–1.24)
GFR, Estimated: >60 mL/min (ref >60)
Glucose, Bld: 209 mg/dL — ABNORMAL HIGH (ref 70–99)
Potassium: 3.9 mmol/L (ref 3.5–5.1)
Sodium: 142 mmol/L (ref 135–145)
Total Bilirubin: 0.6 mg/dL (ref 0.3–1.2)
Total Protein: 7 g/dL (ref 6.5–8.1)

## 2022-06-12 LAB — SALICYLATE LEVEL: Salicylate Lvl: <7 mg/dL — ABNORMAL LOW (ref 7.0–30.0)

## 2022-06-12 LAB — ACETAMINOPHEN LEVEL: Acetaminophen (Tylenol), Serum: <10 ug/mL — ABNORMAL LOW (ref 10–30)

## 2022-06-12 LAB — ETHANOL: Alcohol, Ethyl (B): <10 mg/dL (ref <10)

## 2022-06-12 NOTE — ED Provider Notes (Signed)
[facility] Provider Note    Event Date/Time   First MD Initiated Contact with Patient 06/12/22 2226     (approximate)   History   Chief Complaint: Drug Overdose   HPI  Eric Clarke is a 31 y.o. male with a past history of polysubstance abuse who was brought to the ED by EMS after apparent opiate overdose at home.  Reportedly the patient was cleaning out a storage area in the home when mom found him unconscious and purple and not breathing.  She started CPR and called 911.  EMS came and gave the patient 2 mg of Narcan which resulted in rapid improvement and recovery of mental status.  Patient is awake and reports that he has a history of intranasal heroin use.  While cleaning, he found a baggy of heroin in a golf bag and used some intranasally, after which he got very lightheaded and lost consciousness.  He also reports using THC this morning and methamphetamines 2 days ago.  Denies chest pain right now but does feel somewhat short of breath.  Also complains of a generalized headache.     Physical Exam   Triage Vital Signs: ED Triage Vitals  Enc Vitals Group     BP 06/12/22 2230 (!) 154/106     Pulse Rate 06/12/22 2230 98     Resp 06/12/22 2230 18     Temp --      Temp src --      SpO2 06/12/22 2230 (!) 75 %     Weight 06/12/22 2228 149 lb 14.6 oz (68 kg)     Height 06/12/22 2228 5\' 8"  (1.727 m)     Head Circumference --      Peak Flow --      Pain Score 06/12/22 2227 8     Pain Loc --      Pain Edu? --      Excl. in [place]? --     Most recent vital signs: Vitals:   06/12/22 2230 06/12/22 2236  BP: (!) 154/106 (!) 139/101  Pulse: 98 89  Resp: 18 14  SpO2: (!) 75% 98%    General: Awake, no distress.   CV:  Good peripheral perfusion.  Regular rate and rhythm, normal distal pulses Resp:  Normal effort.  Clear to auscultation bilaterally, no crackles Abd:  No distention.  Soft nontender Other:  No soft tissue inflammatory changes.   Neurologically intact.  No signs of head trauma.  No midline spinal tenderness.   ED Results / Procedures / Treatments   Labs (all labs ordered are listed, but only abnormal results are displayed) Labs Reviewed  COMPREHENSIVE METABOLIC PANEL - Abnormal; Notable for the following components:      Result Value   Glucose, Bld 209 (*)    Calcium 8.7 (*)    AST 48 (*)    ALT 67 (*)    All other components within normal limits  CBC WITH DIFFERENTIAL/PLATELET - Abnormal; Notable for the following components:   WBC 14.3 (*)    Neutro Abs 10.7 (*)    Abs Immature Granulocytes 0.24 (*)    All other components within normal limits  ACETAMINOPHEN LEVEL  ETHANOL  SALICYLATE LEVEL     EKG Interpreted by me Sinus rhythm rate of 90.  Normal axis and intervals.  Normal QRS ST segments and T waves.   RADIOLOGY Chest x-ray interpreted by me, appears normal.  Radiology report reviewed   PROCEDURES:  .Critical  Care  Performed by: Sharman Cheek, MD Authorized by: Sharman Cheek, MD   Critical care provider statement:    Critical care time (minutes):  35   Critical care time was exclusive of:  Separately billable procedures and treating other patients   Critical care was necessary to treat or prevent imminent or life-threatening deterioration of the following conditions:  Respiratory failure and toxidrome   Critical care was time spent personally by me on the following activities:  Development of treatment plan with patient or surrogate, discussions with consultants, evaluation of patient's response to treatment, examination of patient, obtaining history from patient or surrogate, ordering and performing treatments and interventions, ordering and review of laboratory studies, ordering and review of radiographic studies, pulse oximetry, re-evaluation of patient's condition and review of old charts   Care discussed with: admitting provider   Comments:        .1-3 Lead EKG  Interpretation  Performed by: Sharman Cheek, MD Authorized by: Sharman Cheek, MD     Interpretation: normal     ECG rate:  70   ECG rate assessment: normal     Rhythm: sinus rhythm     Ectopy: none     Conduction: normal      MEDICATIONS ORDERED IN ED: Medications - No data to display   IMPRESSION / MDM / ASSESSMENT AND PLAN / ED COURSE  I reviewed the triage vital signs and the nursing notes.                              Differential diagnosis includes, but is not limited to, pleural effusion, pulmonary edema, aspiration pneumonitis, pulmonary contusion  Patient's presentation is most consistent with acute presentation with potential threat to life or bodily function.  Patient presents with shortness of breath and hypoxia with room air oxygen saturation of 75% after accidental opiate overdose at home necessitating CPR.  On arrival to the ED, vital signs are stable other than the severe hypoxia which is corrected initially with a nonrebreather and was able to be titrated down to 4 L nasal cannula.  Chest x-ray clear initially, but he will require overnight observation to ensure resolution of his hypoxic respiratory failure.  Case discussed with hospitalist       FINAL CLINICAL IMPRESSION(S) / ED DIAGNOSES   Final diagnoses:  Accidental overdose of heroin, initial encounter (HCC)  Acute respiratory failure with hypoxia (HCC)     Rx / DC Orders   ED Discharge Orders     None        Note:  This document was prepared using Dragon voice recognition software and may include unintentional dictation errors.   Sharman Cheek, MD 06/12/22 [phone]

## 2022-06-12 NOTE — H&P (Signed)
History and Physical    Patient: Eric Clarke [id number] DOB: 1990-12-14 DOA: 06/12/2022 DOS: the patient was seen and examined on 06/12/2022 PCP: Patient, No Pcp Per  Patient coming from: Home  Chief Complaint:  Chief Complaint  Patient presents with   Drug Overdose    HPI: Eric Clarke is a 31 y.o. male with medical history significant for Heroin and methamphetamine abuse with 4 visits to the ED in the past year for heroin overdose reversed with Narcan most recently in July 2023, who was brought to the ED after heroin overdose, reversed with Narcan administered by EMS.  EMS was called out when his mother found him unconscious, purple and not breathing in a storage area of the home.  She immediately started CPR.  On arrival of EMS they administered 2 mg of Narcan with rapid recovery.  He is estimated to have been unconscious for about 20 minutes.  He arrived to the ED on room air and awake and alert.  He complained of shortness of breath and a headache but denied chest pain. ED course and data review: On arrival BP 154/106 with pulse 98.  O2 sat dropped to 75% on arrival and he was placed on O2 at 4 L with improvement to 98%.  Labs significant for WBC 14,000, glucose 209, AST 48 and ALT 67.  EtOH, salicylate and acetaminophen levels undetectable.EKG, personally viewed and interpreted showed sinus rhythm at 90 with borderline prolonged QT interval.  Chest x-ray showed no active disease. Patient remained alert and oriented while in the ED but due to oxygen requirement, hospitalization consulted for admission.   Review of Systems: As mentioned in the history of present illness. All other systems reviewed and are negative.  Past Medical History:  Diagnosis Date   Substance abuse [facility])    Past Surgical History:  Procedure Laterality Date   FRACTURE SURGERY     Social History:  reports that he has been smoking cigarettes. He has been smoking an average of .5 packs per day.  His smokeless tobacco use includes chew. He reports current alcohol use. He reports that he does not currently use drugs after having used the following drugs: Methamphetamines, Marijuana, and IV.  Allergies  Allergen Reactions   Doxycycline Anaphylaxis    History reviewed. No pertinent family history.  Prior to Admission medications   Medication Sig Start Date End Date Taking? Authorizing Provider  prochlorperazine (COMPAZINE) 10 MG tablet Take 1 tablet (10 mg total) by mouth every 8 (eight) hours as needed for vomiting (headache). 03/19/21   Nance Pear, MD    Physical Exam: Vitals:   06/12/22 2228 06/12/22 2230 06/12/22 2236  BP:  (!) 154/106 (!) 139/101  Pulse:  98 89  Resp:  18 14  SpO2:  (!) 75% 98%  Weight: 68 kg    Height: 5\' 8"  (1.727 m)     Physical Exam  Labs on Admission: I have personally reviewed following labs and imaging studies  CBC: Recent Labs  Lab 06/12/22 2237  WBC 14.3*  NEUTROABS 10.7*  HGB 13.9  HCT 40.8  MCV 93.4  PLT 123456   Basic Metabolic Panel: Recent Labs  Lab 06/12/22 2237  NA 142  K 3.9  CL 104  CO2 30  GLUCOSE 209*  BUN 18  CREATININE 1.09  CALCIUM 8.7*   GFR: Estimated Creatinine Clearance: 93.6 mL/min (by C-G formula based on SCr of 1.09 mg/dL). Liver Function Tests: Recent Labs  Lab 06/12/22 2237  AST 48*  ALT 67*  ALKPHOS 57  BILITOT 0.6  PROT 7.0  ALBUMIN 4.0   No results for input(s): "LIPASE", "AMYLASE" in the last 168 hours. No results for input(s): "AMMONIA" in the last 168 hours. Coagulation Profile: No results for input(s): "INR", "PROTIME" in the last 168 hours. Cardiac Enzymes: No results for input(s): "CKTOTAL", "CKMB", "CKMBINDEX", "TROPONINI" in the last 168 hours. BNP (last 3 results) No results for input(s): "PROBNP" in the last 8760 hours. HbA1C: No results for input(s): "HGBA1C" in the last 72 hours. CBG: No results for input(s): "GLUCAP" in the last 168 hours. Lipid Profile: No  results for input(s): "CHOL", "HDL", "LDLCALC", "TRIG", "CHOLHDL", "LDLDIRECT" in the last 72 hours. Thyroid Function Tests: No results for input(s): "TSH", "T4TOTAL", "FREET4", "T3FREE", "THYROIDAB" in the last 72 hours. Anemia Panel: No results for input(s): "VITAMINB12", "FOLATE", "FERRITIN", "TIBC", "IRON", "RETICCTPCT" in the last 72 hours. Urine analysis:    Component Value Date/Time   COLORURINE YELLOW (A) 05/30/2019 1734   APPEARANCEUR HAZY (A) 05/30/2019 1734   LABSPEC 1.024 05/30/2019 1734   PHURINE 5.0 05/30/2019 1734   GLUCOSEU NEGATIVE 05/30/2019 1734   HGBUR NEGATIVE 05/30/2019 1734   BILIRUBINUR NEGATIVE 05/30/2019 1734   KETONESUR NEGATIVE 05/30/2019 1734   PROTEINUR 30 (A) 05/30/2019 1734   NITRITE NEGATIVE 05/30/2019 1734   LEUKOCYTESUR NEGATIVE 05/30/2019 1734    Radiological Exams on Admission: DG Chest Portable 1 View  Result Date: 06/12/2022 CLINICAL DATA:  Hypoxia, heroin overdose EXAM: PORTABLE CHEST 1 VIEW COMPARISON:  10/04/2021 FINDINGS: The heart size and mediastinal contours are within normal limits. Both lungs are clear. The visualized skeletal structures are unremarkable. IMPRESSION: No active disease. Electronically Signed   By: Sharlet Salina M.D.   On: 06/12/2022 22:55     Data Reviewed: Relevant notes from primary care and specialist visits, past discharge summaries as available in EHR, including Care Everywhere. Prior diagnostic testing as pertinent to current admission diagnoses Updated medications and problem lists for reconciliation ED course, including vitals, labs, imaging, treatment and response to treatment Triage notes, nursing and pharmacy notes and ED provider's notes Notable results as noted in HPI   Assessment and Plan: * Opioid overdose, accidental or unintentional, initial encounter (HCC) Acute respiratory failure with hypoxia Acute respiratory arrest secondary to opiate overdose-resolved Patient responded to CPR and  subsequently Narcan Persistent hypoxia could be early signs of aspiration but chest x-ray clear. Continue supplemental O2 to keep sats over 94% Aspiration precautions  Amphetamine abuse (HCC) Heroin abuse Counseled on abstinence from all substance use        DVT prophylaxis: Lovenox  Consults: none  Advance Care Planning:   Code Status: Prior   Family Communication: none  Disposition Plan: Back to previous home environment  Severity of Illness: The appropriate patient status for this patient is INPATIENT. Inpatient status is judged to be reasonable and necessary in order to provide the required intensity of service to ensure the patient's safety. The patient's presenting symptoms, physical exam findings, and initial radiographic and laboratory data in the context of their chronic comorbidities is felt to place them at high risk for further clinical deterioration. Furthermore, it is not anticipated that the patient will be medically stable for discharge from the hospital within 2 midnights of admission.   * I certify that at the point of admission it is my clinical judgment that the patient will require inpatient hospital care spanning beyond 2 midnights from the point of admission due to high intensity of service, high  risk for further deterioration and high frequency of surveillance required.*  Author: Athena Masse, MD 06/12/2022 11:59 PM  For on call review [email or web address].

## 2022-06-12 NOTE — ED Triage Notes (Signed)
Pt to ED via [facility] with c/o drug overdose. Per EMS pt admits to heroin use tonight. Per EMS 2mg  Narcan given on scene. Per EMS pt's mother stated pt down for approx 25 minutes and was purple, pts' mom gave CPR prior to EMS arrival. Per EMS pt admits to using "old heroin".  18G L AC Pt arrives to ED A&O x4, able to stand and give urine sample on arrival to ED.

## 2022-06-13 DIAGNOSIS — J9601 Acute respiratory failure with hypoxia: Secondary | ICD-10-CM

## 2022-06-13 DIAGNOSIS — F1920 Other psychoactive substance dependence, uncomplicated: Secondary | ICD-10-CM

## 2022-06-13 LAB — CBC
HCT: 39.8 % (ref 39.0–52.0)
Hemoglobin: 13.4 g/dL (ref 13.0–17.0)
MCH: 31.3 pg (ref 26.0–34.0)
MCHC: 33.7 g/dL (ref 30.0–36.0)
MCV: 93 fL (ref 80.0–100.0)
Platelets: 333 10*3/uL (ref 150–400)
RBC: 4.28 MIL/uL (ref 4.22–5.81)
RDW: 12.1 % (ref 11.5–15.5)
WBC: 15.9 10*3/uL — ABNORMAL HIGH (ref 4.0–10.5)
nRBC: 0 % (ref 0.0–0.2)

## 2022-06-13 LAB — HIV ANTIBODY (ROUTINE TESTING W REFLEX): HIV Screen 4th Generation wRfx: NONREACTIVE

## 2022-06-13 LAB — PROCALCITONIN: Procalcitonin: 0.16 ng/mL

## 2022-06-13 MED ORDER — AMOXICILLIN-POT CLAVULANATE 875-125 MG PO TABS: 1.0000 | ORAL_TABLET | Freq: Two times a day (BID) | ORAL | 0 refills | Status: DC

## 2022-06-13 MED ORDER — ENOXAPARIN SODIUM 40 MG/0.4ML IJ SOSY: 40.0000 mg | PREFILLED_SYRINGE | INTRAMUSCULAR | Status: DC

## 2022-06-13 MED ORDER — ONDANSETRON HCL 4 MG PO TABS: 4.0000 mg | ORAL_TABLET | Freq: Four times a day (QID) | ORAL | Status: DC | PRN

## 2022-06-13 MED ORDER — ACETAMINOPHEN 325 MG RE SUPP: 650.0000 mg | Freq: Four times a day (QID) | RECTAL | Status: DC | PRN

## 2022-06-13 MED ORDER — SODIUM CHLORIDE 0.9 % IV SOLN: INTRAVENOUS | Status: AC

## 2022-06-13 MED ORDER — ACETAMINOPHEN 325 MG PO TABS: 650.0000 mg | ORAL_TABLET | Freq: Four times a day (QID) | ORAL | Status: DC | PRN

## 2022-06-13 MED ORDER — ONDANSETRON HCL 4 MG/2ML IJ SOLN: 4.0000 mg | Freq: Four times a day (QID) | INTRAMUSCULAR | Status: DC | PRN

## 2022-06-13 NOTE — Discharge Summary (Signed)
Physician Discharge Summary   Patient: Eric Clarke MRN: [id number] DOB: 11-18-1990  Admit date:     06/12/2022  Discharge date: 06/13/22  Discharge Physician: Hollice Espy   PCP: Patient, No Pcp Per   Recommendations at discharge:   Patient left AGAINST MEDICAL ADVICE. New medication: Augmentin 875 p.o. twice daily x3 days.  Patient's mother was called and informed and states she will pick up this medication.  Discharge Diagnoses: Active Problems:   Polysubstance dependence including opioid type drug with complication, episodic abuse (HCC)  Principal Problem (Resolved):   Opioid overdose, accidental or unintentional, initial encounter [facility]) Resolved Problems:   Acute respiratory failure with hypoxia [facility])  Hospital Course: 31 year old male with past medical history of heroin and methamphetamine abuse with a number of visits this past year for heroin overdose, most recently 4 months ago was brought into the emergency room on the night of 11/12 after being found by patient's mother and found to have another heroin overdose.  Patient's mother started CPR and EMS was called and Narcan was given.  Symptoms appear to be reversed and patient was stable.  Initially oxygen saturations were normal, but then dropped to 75% on arrival and patient required 4 L nasal cannula.  Lab work noteworthy for white blood cell count of 14,000.  Patient admitted to the hospitalist service for further evaluation.  By morning of 11/13, patient breathing comfortably on room air.  Further lab work noted an actual increase in his white blood cell count of 15.9.  Procalcitonin barely elevated at 0.16.  Initial plan was to ambulate patient and monitor oxygen saturations, but then after waking and feeling okay, he left AGAINST MEDICAL ADVICE (peripheral IV line removed prior to leaving).  Attending physician spoke with patient's mother following patient leaving and antibiotics sent to patient's  pharmacy.  Assessment and Plan: * Opioid overdose, accidental or unintentional, initial encounter (HCC)-resolved as of 06/13/2022 High risk for recurrence.  Patient has also had a number of hospitalizations and ER visits for the similar issue this past year.  Able to be successfully resuscitated at this time.  Oxygen saturations remained stable, however patient left AGAINST MEDICAL ADVICE before further evaluation and formal medical clearance given.   Acute respiratory failure with hypoxia (HCC)-resolved as of 06/13/2022 Possibly from aspiration given white blood cell count minimally elevated procalcitonin, although more likely may have been residual from respiratory suppression from overdose.  Oxygen saturations stable by following morning.  Nevertheless, sent prescription for Augmentin 875 twice daily x3 days to patient's pharmacy and patient's mother informed so that she will ensure that he takes this medication.  Polysubstance dependence including opioid type drug with complication, episodic abuse [facility]) Patient with history of amphetamine and heroin use.  With this episode of heroin use that caused his overdose.  Fortunately, patient was able to be successfully resuscitated.  Left prior to further counseling.          Consultants: None Procedures performed: None Disposition: Left AMA Diet recommendation:  Regular diet DISCHARGE MEDICATION: Allergies as of 06/13/2022       Reactions   Doxycycline Anaphylaxis        Medication List     TAKE these medications    amoxicillin-clavulanate 875-125 MG tablet Commonly known as: AUGMENTIN Take 1 tablet by mouth 2 (two) times daily.        Discharge Exam: Filed Weights   06/12/22 2228  Weight: 68 kg   Patient seen prior to leaving AMA General: No  acute distress Lungs: Clear to auscultation bilaterally Cardiovascular: Regular rate and rhythm, S1-S2  Condition at discharge: improving  The results of significant  diagnostics from this hospitalization (including imaging, microbiology, ancillary and laboratory) are listed below for reference.   Imaging Studies: DG Chest Portable 1 View  Result Date: 06/12/2022 CLINICAL DATA:  Hypoxia, heroin overdose EXAM: PORTABLE CHEST 1 VIEW COMPARISON:  10/04/2021 FINDINGS: The heart size and mediastinal contours are within normal limits. Both lungs are clear. The visualized skeletal structures are unremarkable. IMPRESSION: No active disease. Electronically Signed   By: Sharlet Salina M.D.   On: 06/12/2022 22:55    Microbiology: Results for orders placed or performed during the hospital encounter of 04/05/21  Resp Panel by RT-PCR (Flu A&B, Covid) Nasopharyngeal Swab     Status: None   Collection Time: 04/06/21 12:05 AM   Specimen: Nasopharyngeal Swab; Nasopharyngeal(NP) swabs in vial transport medium  Result Value Ref Range Status   SARS Coronavirus 2 by RT PCR NEGATIVE NEGATIVE Final    Comment: (NOTE) SARS-CoV-2 target nucleic acids are NOT DETECTED.  The SARS-CoV-2 RNA is generally detectable in upper respiratory specimens during the acute phase of infection. The lowest concentration of SARS-CoV-2 viral copies this assay can detect is 138 copies/mL. A negative result does not preclude SARS-Cov-2 infection and should not be used as the sole basis for treatment or other patient management decisions. A negative result may occur with  improper specimen collection/handling, submission of specimen other than nasopharyngeal swab, presence of viral mutation(s) within the areas targeted by this assay, and inadequate number of viral copies(<138 copies/mL). A negative result must be combined with clinical observations, patient history, and epidemiological information. The expected result is Negative.  Fact Sheet for Patients:  BloggerCourse.com  Fact Sheet for Healthcare Providers:  SeriousBroker.it  This test is  no t yet approved or cleared by the [place] FDA and  has been authorized for detection and/or diagnosis of SARS-CoV-2 by FDA under an Emergency Use Authorization (EUA). This EUA will remain  in effect (meaning this test can be used) for the duration of the COVID-19 declaration under Section 564(b)(1) of the Act, 21 U.S.C.section 360bbb-3(b)(1), unless the authorization is terminated  or revoked sooner.       Influenza A by PCR NEGATIVE NEGATIVE Final   Influenza B by PCR NEGATIVE NEGATIVE Final    Comment: (NOTE) The Xpert Xpress SARS-CoV-2/FLU/RSV plus assay is intended as an aid in the diagnosis of influenza from Nasopharyngeal swab specimens and should not be used as a sole basis for treatment. Nasal washings and aspirates are unacceptable for Xpert Xpress SARS-CoV-2/FLU/RSV testing.  Fact Sheet for Patients: BloggerCourse.com  Fact Sheet for Healthcare Providers: SeriousBroker.it  This test is not yet approved or cleared by the [place] FDA and has been authorized for detection and/or diagnosis of SARS-CoV-2 by FDA under an Emergency Use Authorization (EUA). This EUA will remain in effect (meaning this test can be used) for the duration of the COVID-19 declaration under Section 564(b)(1) of the Act, 21 U.S.C. section 360bbb-3(b)(1), unless the authorization is terminated or revoked.  Performed at [facility], [address] [place], [place] [address]     Labs: CBC: Recent Labs  Lab 06/12/22 2237 06/13/22 1042  WBC 14.3* 15.9*  NEUTROABS 10.7*  --   HGB 13.9 13.4  HCT 40.8 39.8  MCV 93.4 93.0  PLT 364 333   Basic Metabolic Panel: Recent Labs  Lab 06/12/22 2237  NA 142  K  3.9  CL 104  CO2 30  GLUCOSE 209*  BUN 18  CREATININE 1.09  CALCIUM 8.7*   Liver Function Tests: Recent Labs  Lab 06/12/22 2237  AST 48*  ALT 67*  ALKPHOS 57  BILITOT 0.6  PROT 7.0  ALBUMIN 4.0    CBG: No results for input(s): "GLUCAP" in the last 168 hours.  Discharge time spent: less than 30 minutes.  Signed: Hollice Espy, MD [facility] 06/13/2022

## 2022-06-13 NOTE — ED Notes (Signed)
Family at bedside. Pt given ginger ale

## 2022-06-13 NOTE — Hospital Course (Signed)
31 year old male with past medical history of heroin and methamphetamine abuse with a number of visits this past year for heroin overdose, most recently 4 months ago was brought into the emergency room on the night of 11/12 after being found by patient's mother and found to have another heroin overdose.  Patient's mother started CPR and EMS was called and Narcan was given.  Symptoms appear to be reversed and patient was stable.  Initially oxygen saturations were normal, but then dropped to 75% on arrival and patient required 4 L nasal cannula.  Lab work noteworthy for white blood cell count of 14,000.  Patient admitted to the hospitalist service for further evaluation.  By morning of 11/13, patient breathing comfortably on room air.  Further lab work noted an actual increase in his white blood cell count of 15.9.  Procalcitonin barely elevated at 0.16.  Initial plan was to ambulate patient and monitor oxygen saturations, but then after waking and feeling okay, he left AGAINST MEDICAL ADVICE (peripheral IV line removed prior to leaving).  Attending physician spoke with patient's mother following patient leaving and antibiotics sent to patient's pharmacy.

## 2022-06-13 NOTE — Assessment & Plan Note (Addendum)
High risk for recurrence.  Patient has also had a number of hospitalizations and ER visits for the similar issue this past year.  Able to be successfully resuscitated at this time.  Oxygen saturations remained stable, however patient left AGAINST MEDICAL ADVICE before further evaluation and formal medical clearance given.

## 2022-06-13 NOTE — Assessment & Plan Note (Addendum)
Patient with history of amphetamine and heroin use.  With this episode of heroin use that caused his overdose.  Fortunately, patient was able to be successfully resuscitated.  Left prior to further counseling.

## 2022-06-13 NOTE — Assessment & Plan Note (Signed)
Possibly from aspiration given white blood cell count minimally elevated procalcitonin, although more likely may have been residual from respiratory suppression from overdose.  Oxygen saturations stable by following morning.  Nevertheless, sent prescription for Augmentin 875 twice daily x3 days to patient's pharmacy and patient's mother informed so that she will ensure that he takes this medication.

## 2022-08-10 ENCOUNTER — Emergency Department
Admission: EM | Admit: 2022-08-10 | Discharge: 2022-08-10 | Discharge status: Against Medical Advice | Payer: Self-pay | Attending: Emergency Medicine | Admitting: Emergency Medicine

## 2022-08-10 ENCOUNTER — Other Ambulatory Visit: Payer: Self-pay

## 2022-08-10 ENCOUNTER — Encounter: Payer: Self-pay | Admitting: *Deleted

## 2022-08-10 DIAGNOSIS — Z5321 Procedure and treatment not carried out due to patient leaving prior to being seen by health care provider: Secondary | ICD-10-CM | POA: Insufficient documentation

## 2022-08-10 DIAGNOSIS — L02413 Cutaneous abscess of right upper limb: Secondary | ICD-10-CM | POA: Insufficient documentation

## 2022-08-10 LAB — CBC WITH DIFFERENTIAL/PLATELET
Abs Immature Granulocytes: 0.01 10*3/uL (ref 0.00–0.07)
Basophils Absolute: 0 10*3/uL (ref 0.0–0.1)
Basophils Relative: 1 %
Eosinophils Absolute: 0.4 10*3/uL (ref 0.0–0.5)
Eosinophils Relative: 6 %
HCT: 41.9 % (ref 39.0–52.0)
Hemoglobin: 13.8 g/dL (ref 13.0–17.0)
Immature Granulocytes: 0 %
Lymphocytes Relative: 33 %
Lymphs Abs: 2.1 10*3/uL (ref 0.7–4.0)
MCH: 30.7 pg (ref 26.0–34.0)
MCHC: 32.9 g/dL (ref 30.0–36.0)
MCV: 93.3 fL (ref 80.0–100.0)
Monocytes Absolute: 0.7 10*3/uL (ref 0.1–1.0)
Monocytes Relative: 11 %
Neutro Abs: 3.2 10*3/uL (ref 1.7–7.7)
Neutrophils Relative %: 49 %
Platelets: 412 10*3/uL — ABNORMAL HIGH (ref 150–400)
RBC: 4.49 MIL/uL (ref 4.22–5.81)
RDW: 11.4 % — ABNORMAL LOW (ref 11.5–15.5)
WBC: 6.4 10*3/uL (ref 4.0–10.5)
nRBC: 0 % (ref 0.0–0.2)

## 2022-08-10 LAB — COMPREHENSIVE METABOLIC PANEL
ALT: 31 U/L (ref 0–44)
AST: 32 U/L (ref 15–41)
Albumin: 3.4 g/dL — ABNORMAL LOW (ref 3.5–5.0)
Alkaline Phosphatase: 60 U/L (ref 38–126)
Anion gap: 10 (ref 5–15)
BUN: 15 mg/dL (ref 6–20)
CO2: 25 mmol/L (ref 22–32)
Calcium: 8.5 mg/dL — ABNORMAL LOW (ref 8.9–10.3)
Chloride: 102 mmol/L (ref 98–111)
Creatinine, Ser: 0.67 mg/dL (ref 0.61–1.24)
GFR, Estimated: >60 mL/min (ref >60)
Glucose, Bld: 103 mg/dL — ABNORMAL HIGH (ref 70–99)
Potassium: 4.1 mmol/L (ref 3.5–5.1)
Sodium: 137 mmol/L (ref 135–145)
Total Bilirubin: 0.5 mg/dL (ref 0.3–1.2)
Total Protein: 6.6 g/dL (ref 6.5–8.1)

## 2022-08-10 LAB — LACTIC ACID, PLASMA: Lactic Acid, Venous: 1.5 mmol/L (ref 0.5–1.9)

## 2022-08-10 NOTE — ED Provider Triage Note (Signed)
Emergency Medicine Provider Triage Evaluation Note  Eric Clarke , a 32 y.o. male  was evaluated in triage.  Pt complains of possible infection/bug bite to arm. HX iv drug abuse but no  recent use. HX mrsa in the past. Pain from wrist to elbow.  Review of Systems  Positive: Soft tissue infection Negative: Fever, chills  Physical Exam  BP (!) 142/96 (BP Location: Left Arm)   Pulse 95   Temp 98.2 F (36.8 C) (Oral)   Resp 18   Ht 5\' 8"  (1.727 m)   Wt 68 kg   SpO2 100%   BMI 22.81 kg/m  Gen:   Awake, no distress   Resp:  Normal effort  MSK:   Moves extremities without difficulty  Other:    Medical Decision Making  Medically screening exam initiated at 4:32 PM.  Appropriate orders placed.  Horton Marshall was informed that the remainder of the evaluation will be completed by another provider, this initial triage assessment does not replace that evaluation, and the importance of remaining in the ED until their evaluation is complete.  Labs   Darletta Moll, [place] 08/10/22 [phone]

## 2022-08-10 NOTE — ED Notes (Signed)
Call x1 with no answer from lobby 

## 2022-08-10 NOTE — ED Notes (Signed)
Call x3 with no answer from lobby

## 2022-08-10 NOTE — ED Notes (Signed)
Call x2 with no answer from lobby.

## 2022-08-10 NOTE — ED Triage Notes (Addendum)
Pt has abscess to right forearm for 2 days   drainage noted.  Area to tender to touch with redness and swelling to hand.   Pt also has itching scrotum for 3 months.   Pt alert

## 2022-09-05 ENCOUNTER — Ambulatory Visit (LOCAL_COMMUNITY_HEALTH_CENTER): Payer: Self-pay

## 2022-09-05 DIAGNOSIS — Z719 Counseling, unspecified: Secondary | ICD-10-CM

## 2022-09-05 DIAGNOSIS — Z1159 Encounter for screening for other viral diseases: Secondary | ICD-10-CM

## 2022-09-05 NOTE — Progress Notes (Signed)
Walk into  [facility] requesting testing for Hep C. States he donated plasma back in Oct 2023. 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 Pt stated he had been in jail recently in November-December 2023 so unable to f/u. Added onto schedule today. Spoke w/ pt about getting tested vs. Scheduling std screening. Pt agreed to get hep B/C testing done today and would schedule appt for STD screening. 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 Also stated had been incarcerated previously in 2015-2016,received tattoos in prison while there. Left room to get paperwork prepared. When returned to room to walk to lab-was not in room. Did not stay to get lab work drawn.

## 2022-09-08 ENCOUNTER — Telehealth: Payer: Self-pay

## 2022-09-08 NOTE — Telephone Encounter (Signed)
[facility] received a phone call from pt's mother inquiring about appt for this pt, same phone number as listed in pt's chart.  (CD RN: At last [facility] appt, discussed sti screening and testing. He donated plasma and +HCV antibody needed f/u. He has he of IVD and incarcerated received tattoos while in prison.)   Offer STD appt along with needed follow-up labs.

## 2022-09-08 NOTE — Telephone Encounter (Signed)
Phone call to pt at [phone]. Pt's mother answered phone. RN requested to speak with pt, mother stated he was not there at the moment. Mother stated in the morning would probably be a better time to reach him. RN stated she would try to reach pt in the morning.

## 2022-09-10 ENCOUNTER — Inpatient Hospital Stay: Payer: Self-pay

## 2022-09-10 ENCOUNTER — Other Ambulatory Visit: Payer: Self-pay

## 2022-09-10 ENCOUNTER — Inpatient Hospital Stay
Admission: EM | Admit: 2022-09-10 | Discharge: 2022-09-12 | DRG: 480 | Discharge status: Against Medical Advice | Payer: Self-pay | Attending: Internal Medicine | Admitting: Internal Medicine

## 2022-09-10 ENCOUNTER — Emergency Department: Payer: Self-pay

## 2022-09-10 DIAGNOSIS — Z5329 Procedure and treatment not carried out because of patient's decision for other reasons: Secondary | ICD-10-CM | POA: Diagnosis present

## 2022-09-10 DIAGNOSIS — F1990 Other psychoactive substance use, unspecified, uncomplicated: Secondary | ICD-10-CM | POA: Diagnosis present

## 2022-09-10 DIAGNOSIS — F1910 Other psychoactive substance abuse, uncomplicated: Secondary | ICD-10-CM

## 2022-09-10 DIAGNOSIS — Z881 Allergy status to other antibiotic agents status: Secondary | ICD-10-CM

## 2022-09-10 DIAGNOSIS — Z8249 Family history of ischemic heart disease and other diseases of the circulatory system: Secondary | ICD-10-CM

## 2022-09-10 DIAGNOSIS — M009 Pyogenic arthritis, unspecified: Secondary | ICD-10-CM | POA: Diagnosis present

## 2022-09-10 DIAGNOSIS — Z720 Tobacco use: Secondary | ICD-10-CM

## 2022-09-10 DIAGNOSIS — M25452 Effusion, left hip: Principal | ICD-10-CM

## 2022-09-10 DIAGNOSIS — F17210 Nicotine dependence, cigarettes, uncomplicated: Secondary | ICD-10-CM | POA: Diagnosis present

## 2022-09-10 DIAGNOSIS — E871 Hypo-osmolality and hyponatremia: Secondary | ICD-10-CM | POA: Diagnosis present

## 2022-09-10 DIAGNOSIS — M00052 Staphylococcal arthritis, left hip: Principal | ICD-10-CM | POA: Diagnosis present

## 2022-09-10 DIAGNOSIS — G928 Other toxic encephalopathy: Secondary | ICD-10-CM | POA: Diagnosis present

## 2022-09-10 DIAGNOSIS — Z833 Family history of diabetes mellitus: Secondary | ICD-10-CM

## 2022-09-10 DIAGNOSIS — G9341 Metabolic encephalopathy: Secondary | ICD-10-CM | POA: Diagnosis present

## 2022-09-10 DIAGNOSIS — F1110 Opioid abuse, uncomplicated: Secondary | ICD-10-CM | POA: Diagnosis present

## 2022-09-10 DIAGNOSIS — B9562 Methicillin resistant Staphylococcus aureus infection as the cause of diseases classified elsewhere: Secondary | ICD-10-CM | POA: Diagnosis present

## 2022-09-10 LAB — CBC WITH DIFFERENTIAL/PLATELET
Abs Immature Granulocytes: 0.06 10*3/uL (ref 0.00–0.07)
Basophils Absolute: 0 10*3/uL (ref 0.0–0.1)
Basophils Relative: 0 %
Eosinophils Absolute: 0.4 10*3/uL (ref 0.0–0.5)
Eosinophils Relative: 3 %
HCT: 41.7 % (ref 39.0–52.0)
Hemoglobin: 14.2 g/dL (ref 13.0–17.0)
Immature Granulocytes: 1 %
Lymphocytes Relative: 22 %
Lymphs Abs: 2.7 10*3/uL (ref 0.7–4.0)
MCH: 30.5 pg (ref 26.0–34.0)
MCHC: 34.1 g/dL (ref 30.0–36.0)
MCV: 89.5 fL (ref 80.0–100.0)
Monocytes Absolute: 1.2 10*3/uL — ABNORMAL HIGH (ref 0.1–1.0)
Monocytes Relative: 10 %
Neutro Abs: 7.5 10*3/uL (ref 1.7–7.7)
Neutrophils Relative %: 64 %
Platelets: 405 10*3/uL — ABNORMAL HIGH (ref 150–400)
RBC: 4.66 MIL/uL (ref 4.22–5.81)
RDW: 11.9 % (ref 11.5–15.5)
WBC: 11.9 10*3/uL — ABNORMAL HIGH (ref 4.0–10.5)
nRBC: 0 % (ref 0.0–0.2)

## 2022-09-10 LAB — URINE DRUG SCREEN, QUALITATIVE (ARMC ONLY)
Amphetamines, Ur Screen: POSITIVE — AB
Barbiturates, Ur Screen: NOT DETECTED
Benzodiazepine, Ur Scrn: NOT DETECTED
Cannabinoid 50 Ng, Ur ~~LOC~~: NOT DETECTED
Cocaine Metabolite,Ur ~~LOC~~: NOT DETECTED
MDMA (Ecstasy)Ur Screen: NOT DETECTED
Methadone Scn, Ur: NOT DETECTED
Opiate, Ur Screen: POSITIVE — AB
Phencyclidine (PCP) Ur S: NOT DETECTED
Tricyclic, Ur Screen: NOT DETECTED

## 2022-09-10 LAB — COMPREHENSIVE METABOLIC PANEL
ALT: 81 U/L — ABNORMAL HIGH (ref 0–44)
AST: 61 U/L — ABNORMAL HIGH (ref 15–41)
Albumin: 4.1 g/dL (ref 3.5–5.0)
Alkaline Phosphatase: 77 U/L (ref 38–126)
Anion gap: 8 (ref 5–15)
BUN: 19 mg/dL (ref 6–20)
CO2: 29 mmol/L (ref 22–32)
Calcium: 9.1 mg/dL (ref 8.9–10.3)
Chloride: 97 mmol/L — ABNORMAL LOW (ref 98–111)
Creatinine, Ser: 0.75 mg/dL (ref 0.61–1.24)
GFR, Estimated: >60 mL/min (ref >60)
Glucose, Bld: 99 mg/dL (ref 70–99)
Potassium: 4.3 mmol/L (ref 3.5–5.1)
Sodium: 134 mmol/L — ABNORMAL LOW (ref 135–145)
Total Bilirubin: 1 mg/dL (ref 0.3–1.2)
Total Protein: 7.6 g/dL (ref 6.5–8.1)

## 2022-09-10 LAB — SEDIMENTATION RATE: Sed Rate: 9 mm/hr (ref 0–15)

## 2022-09-10 LAB — PROTIME-INR
INR: 1 (ref 0.8–1.2)
Prothrombin Time: 13.5 seconds (ref 11.4–15.2)

## 2022-09-10 LAB — CK: Total CK: 257 U/L (ref 49–397)

## 2022-09-10 LAB — APTT: aPTT: 31 seconds (ref 24–36)

## 2022-09-10 LAB — C-REACTIVE PROTEIN: CRP: 2 mg/dL — ABNORMAL HIGH (ref <1.0)

## 2022-09-10 MED ORDER — KETOROLAC TROMETHAMINE 30 MG/ML IJ SOLN
30.0000 mg | Freq: Four times a day (QID) | INTRAMUSCULAR | Status: DC | PRN
  Administered 2022-09-10 – 2022-09-11 (×3): 30 mg via INTRAVENOUS
  Filled 2022-09-10 (×3): qty 1

## 2022-09-10 MED ORDER — IOHEXOL 300 MG/ML  SOLN
100.0000 mL | Freq: Once | INTRAMUSCULAR | Status: AC | PRN
  Administered 2022-09-10: 100 mL via INTRAVENOUS

## 2022-09-10 MED ORDER — VANCOMYCIN HCL 1750 MG/350ML IV SOLN
1750.0000 mg | Freq: Once | INTRAVENOUS | Status: AC
  Administered 2022-09-10: 1750 mg via INTRAVENOUS
  Filled 2022-09-10: qty 350

## 2022-09-10 MED ORDER — NICOTINE 21 MG/24HR TD PT24
21.0000 mg | MEDICATED_PATCH | Freq: Every day | TRANSDERMAL | Status: DC
  Filled 2022-09-10: qty 1

## 2022-09-10 MED ORDER — HYDROMORPHONE HCL 1 MG/ML IJ SOLN
1.0000 mg | Freq: Once | INTRAMUSCULAR | Status: AC
  Administered 2022-09-10: 1 mg via INTRAVENOUS
  Filled 2022-09-10: qty 1

## 2022-09-10 MED ORDER — HEPARIN SODIUM (PORCINE) 5000 UNIT/ML IJ SOLN
5000.0000 [IU] | Freq: Three times a day (TID) | INTRAMUSCULAR | Status: DC
  Administered 2022-09-10: 5000 [IU] via SUBCUTANEOUS
  Filled 2022-09-10: qty 1

## 2022-09-10 MED ORDER — ONDANSETRON HCL 4 MG/2ML IJ SOLN: 4.0000 mg | Freq: Three times a day (TID) | INTRAMUSCULAR | Status: DC | PRN

## 2022-09-10 MED ORDER — SODIUM CHLORIDE 0.9 % IV SOLN
2.0000 g | Freq: Three times a day (TID) | INTRAVENOUS | Status: DC
  Administered 2022-09-10 – 2022-09-12 (×6): 2 g via INTRAVENOUS
  Filled 2022-09-10 (×2): qty 12.5
  Filled 2022-09-10: qty 2
  Filled 2022-09-10 (×2): qty 12.5
  Filled 2022-09-10: qty 2
  Filled 2022-09-10: qty 12.5

## 2022-09-10 MED ORDER — VANCOMYCIN HCL 1500 MG/300ML IV SOLN
1500.0000 mg | Freq: Two times a day (BID) | INTRAVENOUS | Status: DC
  Administered 2022-09-11 – 2022-09-12 (×3): 1500 mg via INTRAVENOUS
  Filled 2022-09-10 (×4): qty 300

## 2022-09-10 MED ORDER — SODIUM CHLORIDE 0.9 % IV BOLUS
1000.0000 mL | Freq: Once | INTRAVENOUS | Status: AC
  Administered 2022-09-10: 1000 mL via INTRAVENOUS

## 2022-09-10 MED ORDER — ACETAMINOPHEN 325 MG PO TABS: 650.0000 mg | ORAL_TABLET | Freq: Four times a day (QID) | ORAL | Status: DC | PRN

## 2022-09-10 MED ORDER — LORAZEPAM 0.5 MG PO TABS: 0.5000 mg | ORAL_TABLET | Freq: Four times a day (QID) | ORAL | Status: DC | PRN

## 2022-09-10 MED ORDER — OXYCODONE-ACETAMINOPHEN 5-325 MG PO TABS: 1.0000 | ORAL_TABLET | ORAL | Status: DC | PRN

## 2022-09-10 NOTE — Progress Notes (Addendum)
Patient was brought to fluoroscopy suite for ordered left hip aspiration. Upon my presentation to the room, the patient stated he did not want to have the procedure done. He agreed to listen to a description of the procedure, as well as its benefits, risks, and alternatives. After this discussion, the patient reaffirmed that he did not want to have the hip aspiration performed. Ordering provider was notified via SecureChat, and patient was returned to his room.  Lura Em, PA-C 09/10/2022 2:42 PM

## 2022-09-10 NOTE — H&P (Signed)
History and Physical    Eric Clarke [id number] DOB: 04-03-1991 DOA: 09/10/2022  Referring MD/NP/PA:   PCP: Patient, No Pcp Per   Patient coming from:  The patient is coming from home.     Chief Complaint: pain in let groin and hip  HPI: Eric Clarke is a 32 y.o. male with medical history significant of IVDU, polysubstance abuse, who presents with pain in right groin and hip.  Patient states that his pain in left groin and left hip started this morning, which is constant, sharp, severe, nonradiating, aggravated by walking. Denies any injury. Says he was not able to ambulate and his friends had to carry him here. Denies testicular pain and urinary symptoms.  Denies back pain to me.  Patient does not have chest pain, cough, shortness.  Denies nausea, vomiting, diarrhea or abdominal pain. Pt states that he used meth 2 days ago.  Per ED physician, initially patient was not confused, but when I saw patient in ED, he is very drowsy, arousable, orientated x 3 when aroused.  Patient moves all extremities, no facial droop or slurred speech.   Data reviewed independently and ED Course: pt was found to have WBC 11.9, CK257, GFR> 60.  Temperature normal, blood pressure 134/74, heart rate 73, RR 13, oxygen sat 97% on room air.  Patient is admitted to [facility] bed as inpatient.  CT-left femur: 1. Moderate-sized left hip joint effusion. Arthrocentesis is recommended to assess for septic arthritis given the patient history. 2. Minimal subcutaneous edema lateral to the left hip at the level of the greater trochanter. No organized fluid collections or soft tissue gas. 3. No acute osseous abnormality.    EKG: I have personally reviewed.  Sinus rhythm, QTc 434, low voltage, nonspecific T wave change.   Review of Systems:   General: no fevers, chills, no body weight gain, has fatigue HEENT: no blurry vision, hearing changes or sore throat Respiratory: no dyspnea, coughing,  wheezing CV: no chest pain, no palpitations GI: no nausea, vomiting, abdominal pain, diarrhea, constipation GU: no dysuria, burning on urination, increased urinary frequency, hematuria  Ext: no leg edema Neuro: no unilateral weakness, numbness, or tingling, no vision change or hearing loss. Pt has drowsiness Skin: no rash, no skin tear. MSK: has pain in left groin and hip Heme: No easy bruising.  Travel history: No recent long distant travel.   Allergy:  Allergies  Allergen Reactions   Doxycycline Anaphylaxis    Past Medical History:  Diagnosis Date   Substance abuse [facility])     Past Surgical History:  Procedure Laterality Date   FRACTURE SURGERY      Social History:  reports that he has been smoking cigarettes. He has been smoking an average of .5 packs per day. His smokeless tobacco use includes chew. He reports that he does not currently use alcohol. He reports that he does not currently use drugs after having used the following drugs: Methamphetamines, Marijuana, and IV.  Family History:  Family History  Problem Relation Age of Onset   Hypertension Father    Diabetes Father      Prior to Admission medications   Medication Sig Start Date End Date Taking? Authorizing Provider  amoxicillin-clavulanate (AUGMENTIN) 875-125 MG tablet Take 1 tablet by mouth 2 (two) times daily. 06/13/22   Annita Brod, MD    Physical Exam: Vitals:   09/10/22 1250 09/10/22 1443 09/10/22 1445 09/10/22 1500  BP: 134/74  132/82 131/81  Pulse: 69  63 68  Resp: 13  11 10  $ Temp:      TempSrc:      SpO2: 97%  95% 96%  Weight:  68 kg    Height:  5' 8"$  (1.727 m)     General: Not in acute distress HEENT:       Eyes: PERRL, EOMI, no scleral icterus.       ENT: No discharge from the ears and nose, no pharynx injection, no tonsillar enlargement.        Neck: No JVD, no bruit, no mass felt. Heme: No neck lymph node enlargement. Cardiac: S1/S2, RRR, No murmurs, No gallops or  rubs. Respiratory: No rales, wheezing, rhonchi or rubs. GI: Soft, nondistended, nontender, no rebound pain, no organomegaly, BS present. GU: No hematuria Ext: No pitting leg edema bilaterally. 1+DP/PT pulse bilaterally. Musculoskeletal: Has severe tenderness over left groin area and left hip, no local erythema  Skin: No rashes.  Neuro: very drowsy, arousable, when aroused, patient is oriented X3, cranial nerves II-XII grossly intact, moves all extremities normally. Psych: Patient is not psychotic, no suicidal or hemocidal ideation.  Labs on Admission: I have personally reviewed following labs and imaging studies  CBC: Recent Labs  Lab 09/10/22 1059  WBC 11.9*  NEUTROABS 7.5  HGB 14.2  HCT 41.7  MCV 89.5  PLT 123456*   Basic Metabolic Panel: Recent Labs  Lab 09/10/22 1059  NA 134*  K 4.3  CL 97*  CO2 29  GLUCOSE 99  BUN 19  CREATININE 0.75  CALCIUM 9.1   GFR: Estimated Creatinine Clearance: 127.5 mL/min (by C-G formula based on SCr of 0.75 mg/dL). Liver Function Tests: Recent Labs  Lab 09/10/22 1059  AST 61*  ALT 81*  ALKPHOS 77  BILITOT 1.0  PROT 7.6  ALBUMIN 4.1   No results for input(s): "LIPASE", "AMYLASE" in the last 168 hours. No results for input(s): "AMMONIA" in the last 168 hours. Coagulation Profile: No results for input(s): "INR", "PROTIME" in the last 168 hours. Cardiac Enzymes: Recent Labs  Lab 09/10/22 1059  CKTOTAL 257   BNP (last 3 results) No results for input(s): "PROBNP" in the last 8760 hours. HbA1C: No results for input(s): "HGBA1C" in the last 72 hours. CBG: No results for input(s): "GLUCAP" in the last 168 hours. Lipid Profile: No results for input(s): "CHOL", "HDL", "LDLCALC", "TRIG", "CHOLHDL", "LDLDIRECT" in the last 72 hours. Thyroid Function Tests: No results for input(s): "TSH", "T4TOTAL", "FREET4", "T3FREE", "THYROIDAB" in the last 72 hours. Anemia Panel: No results for input(s): "VITAMINB12", "FOLATE", "FERRITIN",  "TIBC", "IRON", "RETICCTPCT" in the last 72 hours. Urine analysis:    Component Value Date/Time   COLORURINE YELLOW (A) 05/30/2019 1734   APPEARANCEUR HAZY (A) 05/30/2019 1734   LABSPEC 1.024 05/30/2019 1734   PHURINE 5.0 05/30/2019 1734   GLUCOSEU NEGATIVE 05/30/2019 1734   HGBUR NEGATIVE 05/30/2019 [place] 05/30/2019 [place] 05/30/2019 1734   PROTEINUR 30 (A) 05/30/2019 1734   NITRITE NEGATIVE 05/30/2019 1734   LEUKOCYTESUR NEGATIVE 05/30/2019 1734   Sepsis Labs: @LABRCNTIP$ (procalcitonin:4,lacticidven:4) )No results found for this or any previous visit (from the past 240 hour(s)).   Radiological Exams on Admission: CT FEMUR LEFT W CONTRAST  Result Date: 09/10/2022 CLINICAL DATA:  Soft tissue infection suspected, thigh, no prior imaging patient with lateral thigh pain out of proportion and IVDU, c/f myositis EXAM: CT OF THE LOWER LEFT EXTREMITY WITH CONTRAST TECHNIQUE: Multidetector CT imaging of the lower left extremity was performed according to the  standard protocol following intravenous contrast administration. RADIATION DOSE REDUCTION: This exam was performed according to the departmental dose-optimization program which includes automated exposure control, adjustment of the mA and/or kV according to patient size and/or use of iterative reconstruction technique. CONTRAST:  123m OMNIPAQUE IOHEXOL 300 MG/ML  SOLN COMPARISON:  Same-day x-ray, CT abdomen pelvis 04/04/2020 FINDINGS: Bones/Joint/Cartilage No acute fracture or dislocation. Hip in knee joints are aligned with preservation of the joint space. Moderate-sized left hip joint effusion. No knee joint effusion. No erosions or periosteal elevation. Probable synovial herniation pits along the lateral margin of the left femur at the head-neck junction. No evidence of avascular necrosis. No lytic or sclerotic bone lesion. Ligaments Suboptimally assessed by CT. Muscles and Tendons Musculotendinous  structures appear within normal limits by CT. No intramuscular fluid collection. No deep fascial fluid. Soft tissues Minimal subcutaneous edema lateral to the left hip at the level of the greater trochanter. No organized fluid collections. No soft tissue gas. No left inguinal lymphadenopathy. IMPRESSION: 1. Moderate-sized left hip joint effusion. Arthrocentesis is recommended to assess for septic arthritis given the patient history. 2. Minimal subcutaneous edema lateral to the left hip at the level of the greater trochanter. No organized fluid collections or soft tissue gas. 3. No acute osseous abnormality. Electronically Signed   By: NDavina PokeD.O.   On: 09/10/2022 12:52   DG Femur Min 2 Views Left  Result Date: 09/10/2022 CLINICAL DATA:  Pain EXAM: LEFT FEMUR 4  VIEWS COMPARISON:  None Available. FINDINGS: There is no evidence of fracture or other focal bone lesions. Soft tissues are unremarkable. IMPRESSION: Negative. Electronically Signed   By: JSammie BenchM.D.   On: 09/10/2022 11:25      Assessment/Plan Principal Problem:   Septic hip (HCC) Active Problems:   Acute metabolic encephalopathy   Polysubstance abuse ([place]   Tobacco abuse   Assessment and Plan:  Possible septic hip ([facility]: CT scan showed moderate-sized left hip joint effusion.  Given his history of IV drug use, highly suspect septic left hip joint.  ED physician discussed with Dr. BHarlow Maresof Ortho, who recommended arthrocentesis. DG Fluoro guided needle PCL aspiration was ordered by EDP.  Patient was taken to the OR, unfortunately patient refused the procedure.  Both ED physician and I have tried to convince patient to do the procedure, but patient still refused it. Will start empiric antibiotics.  -Admit to MedSurg bed as inpatient -Antibiotics: Vancomycin and cefepime -Blood culture -As needed Tylenol and ketorolac IV for pain control -Will avoid narcotics since patient is very drowsy  Acute metabolic  encephalopathy: Etiology is not clear.  Patient received Dilaudid 1 mg x 2 in ED -Avoid narcotic now -Fall precaution, frequent neurochecks -UDS -f/u CT-head --> negative  Polysubstance abuse (HCC) and Tobacco abuse: -check UDS -Nicotine patch        DVT ppx: SCD  Code Status: Full code  Family Communication: I called his mother   Disposition Plan:  Anticipate discharge back to previous environment  Consults called:  EDP discussed with Dr. BHarlow Maresof Ortho  Admission status and Level of care: Med-Surg:  as inpt     Dispo: The patient is from: Home              Anticipated d/c is to: Home              Anticipated d/c date is: 2 days              Patient currently is  not medically stable to d/c.    Severity of Illness:  The appropriate patient status for this patient is INPATIENT. Inpatient status is judged to be reasonable and necessary in order to provide the required intensity of service to ensure the patient's safety. The patient's presenting symptoms, physical exam findings, and initial radiographic and laboratory data in the context of their chronic comorbidities is felt to place them at high risk for further clinical deterioration. Furthermore, it is not anticipated that the patient will be medically stable for discharge from the hospital within 2 midnights of admission.   * I certify that at the point of admission it is my clinical judgment that the patient will require inpatient hospital care spanning beyond 2 midnights from the point of admission due to high intensity of service, high risk for further deterioration and high frequency of surveillance required.*       Date of Service 09/10/2022    Ivor Costa [facility]   If 7PM-7AM, please contact night-coverage [email or web address] 09/10/2022, 4:00 PM

## 2022-09-10 NOTE — ED Notes (Signed)
Patient transported to CT 

## 2022-09-10 NOTE — ED Notes (Signed)
Patient off the floor to CT 

## 2022-09-10 NOTE — Progress Notes (Addendum)
Pharmacy Antibiotic Note  Eric Clarke is a 32 y.o. male admitted on 09/10/2022 with possible left septic hip joint. PMH includes substance use disorder. Pharmacy has been consulted for vancomycin and cefepime dosing.  Renal function is at baseline and scr < 0.8.   Plan: Vancomycin loading dose 1750 mg x 1. Then, maintenance dose vancomycin 1500 mg every 12 hours Goal AUC 400-600 Vd 0.72, IBW Estimated AUC 552.8, Cmin 13.0  Cefepime 2 grams every 8 hours  Follow up plans for aspiration and culture results as well as renal function for dose adjustments    Temp (24hrs), Avg:97.9 F (36.6 C), Min:97.9 F (36.6 C), Max:97.9 F (36.6 C)  Recent Labs  Lab 09/10/22 1059  WBC 11.9*  CREATININE 0.75    CrCl cannot be calculated (Unknown ideal weight.).    Allergies  Allergen Reactions   Doxycycline Anaphylaxis    Antimicrobials this admission: vancomycin 2/10 >>  cefepime 2/10 >>   Dose adjustments this admission: N/a  Microbiology results: 2/10 BCx: in process 2/10 Left hip synovial fluid: to be collected  Thank you for allowing pharmacy to be a part of this patient's care.  Glean Salvo, PharmD, BCPS Clinical Pharmacist  09/10/2022 2:45 PM

## 2022-09-10 NOTE — ED Notes (Signed)
Pt back from CT

## 2022-09-10 NOTE — ED Triage Notes (Addendum)
Pt to ED via POV from home. Pt reports he woke up this morning with left sided groin pain that radiates to left lower back. Pt denies injury or pain to penis/testicles. Rn also noted pt's hands are red and swollen bilaterally. Pt reports swelling has been present x1 wk. Pt reports etoh use 2 days ago and used "ice" the other day. Pt guarding groin and hunched over in pain. No deformity/swelling/redness noted to pointed area.

## 2022-09-10 NOTE — ED Notes (Signed)
Patient back from CT.

## 2022-09-10 NOTE — ED Provider Notes (Signed)
[facility] Provider Note    Event Date/Time   First MD Initiated Contact with Patient 09/10/22 1028     (approximate)   History   Groin Pain   HPI  Eric Clarke is a 32 y.o. male past medical history of substance use disorder who presents with left leg pain.  Patient says he woke up this morning and had severe pain in the left upper thigh.  Denies preceding trauma.  Says he was not able to ambulate and his friends had to carry him here.  He denies drug use last night.  Denies falling asleep on average surface.  Denies fevers chills.  Denies testicular pain denies urinary symptoms.  Says he had similar pain in the past with muscle cramping.  Says he used meth 2 days ago.     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 --      Excl. in [place]? --     Most recent vital signs: Vitals:   09/10/22 1130 09/10/22 1250  BP: 129/73 134/74  Pulse: 70 69  Resp: 13 13  Temp:    SpO2: 99% 97%     General: Awake, patient appears uncomfortable, groaning CV:  Good peripheral perfusion.  Resp:  Normal effort.  Abd:  No distention.  Neuro:             Awake, Alert, Oriented x 3  Other:  Normal GU exam, no testicular tenderness or  swelling Has tenderness to palpation along the left lateral proximal thigh, the compartment feels soft the patient's pain is out of proportion there is no overlying skin change no crepitus  No tenderness along the knee calf or ankle 2+ DP pulse bilateral lower extremities 5 out of 5 strength with flexion extension bilateral ankles   ED Results / Procedures / Treatments  Labs (all labs ordered are listed, but only abnormal results are displayed) Labs Reviewed  COMPREHENSIVE METABOLIC PANEL - Abnormal; Notable for the following components:      Result Value   Sodium 134 (*)    Chloride 97 (*)    AST 61 (*)    ALT 81 (*)    All other components within normal limits  CBC WITH DIFFERENTIAL/PLATELET - Abnormal; Notable for the following components:   WBC 11.9 (*)    Platelets 405 (*)    Monocytes Absolute 1.2 (*)    All other components within normal limits  CULTURE, BLOOD (ROUTINE X 2)  CULTURE, BLOOD (  ROUTINE X 2)  BODY FLUID CULTURE W GRAM STAIN  CK  URINE DRUG SCREEN, QUALITATIVE ([facility] ONLY)  C-REACTIVE PROTEIN  SEDIMENTATION RATE  GLUCOSE, BODY FLUID OTHER            PROTEIN, BODY FLUID (OTHER)  SYNOVIAL CELL COUNT + DIFF, W/ CRYSTALS     EKG     RADIOLOGY I reviewed and interpreted the x-ray of the left femur which is negative for fracture   PROCEDURES:  Critical Care performed: No  Procedures  The patient is on the cardiac monitor to evaluate for evidence of arrhythmia and/or significant heart rate changes.   MEDICATIONS ORDERED IN ED: Medications  HYDROmorphone (DILAUDID) injection 1 mg (1 mg Intravenous Given 09/10/22 1110)  HYDROmorphone (DILAUDID) injection 1 mg (1 mg Intravenous Given 09/10/22 1151)  iohexol (OMNIPAQUE) 300 MG/ML solution 100 mL (100 mLs Intravenous Contrast Given 09/10/22 1227)     IMPRESSION / MDM / ASSESSMENT AND PLAN / ED COURSE  I reviewed the triage vital signs and the nursing notes.                              Patient's  presentation is most consistent with acute complicated illness / injury requiring diagnostic workup.  Differential diagnosis includes, but is not limited to, compartment syndrome, occult fracture, myositis, rhabdomyolysis, strain  The patient is a 32 year old male with substance use disorder who presents with atraumatic left leg pain.  Started when he woke up from sleep and is located in the left upper thigh.  Patient appears quite uncomfortable on my evaluation and his pain seems somewhat out of proportion.  He is holding the top of the left thigh.  It is tender primarily in the lateral aspect there is no overlying skin change the compartment feels soft there is no crepitus.  He has good distal pulses and distal strength.  Not able to range the leg.  Pelvis itself does not feel tender.  Question whether there is some swelling.  Differential is as above.  Given his IV drug use and concern for possible compartment syndrome or myositis.  Will start with x-ray labs including a CK.  If this is unrevealing we will likely need a CT given severity of pain.  Will treat with IV Dilaudid.  Patient's x-ray of the femur is nonrevealing.  He is still in a significant amount of pain.  He has mild leukocytosis CK is negative.  I did confirm patient does inject meth into his arms but not to the extremities.  Will obtain a CT of the left lower extremity with contrast to evaluate for underlying myositis or abscess.  Will give another dose of Dilaudid.   Patient CT of the femur shows a moderate-sized hip effusion.  This is concerning for possible septic arthritis especially with his IV drug use.  Discussed with Dr. Harlow Mares who recommends arranging arthrocentesis.  Initially discussed with IR who recommends talking to general radiology for fluoroscopy.  We will be able to get fluoroscopy done today.  Given patient's significant pain and limited mobility I think he will need admission regardless.  I have ordered the synovial  studies.  FINAL CLINICAL IMPRESSION(S) / ED DIAGNOSES   Final diagnoses:  Hip joint effusion, left     Rx / DC Orders   ED Discharge Orders     None        Note:  This document was prepared using Dragon voice recognition  software and may include unintentional dictation errors.   Rada Hay, MD 09/10/22 [phone]

## 2022-09-10 NOTE — ED Notes (Signed)
Pt laying comfortably on stretcher, asleep, pt stating pain still the same. Pt not arousing fully and mumbling most words.

## 2022-09-10 NOTE — ED Notes (Signed)
Pt returned from CT, pt would not consent to procedure. Dr. Starleen Blue to be informed.

## 2022-09-10 NOTE — ED Notes (Signed)
Pt now describing some pain in testicles but mostly in the Left hip/groin area.

## 2022-09-11 ENCOUNTER — Inpatient Hospital Stay: Payer: Self-pay | Admitting: Anesthesiology

## 2022-09-11 ENCOUNTER — Encounter
Admission: EM | Discharge status: Against Medical Advice | Payer: Self-pay | Source: Home / Self Care | Attending: Internal Medicine

## 2022-09-11 ENCOUNTER — Other Ambulatory Visit: Payer: Self-pay

## 2022-09-11 DIAGNOSIS — G9341 Metabolic encephalopathy: Secondary | ICD-10-CM

## 2022-09-11 DIAGNOSIS — E871 Hypo-osmolality and hyponatremia: Secondary | ICD-10-CM

## 2022-09-11 HISTORY — PX: INCISION AND DRAINAGE HIP: SHX1801

## 2022-09-11 LAB — SURGICAL PCR SCREEN
MRSA, PCR: POSITIVE — AB
Staphylococcus aureus: POSITIVE — AB

## 2022-09-11 LAB — BASIC METABOLIC PANEL WITH GFR
Anion gap: 6 (ref 5–15)
BUN: 17 mg/dL (ref 6–20)
CO2: 24 mmol/L (ref 22–32)
Calcium: 8.4 mg/dL — ABNORMAL LOW (ref 8.9–10.3)
Chloride: 104 mmol/L (ref 98–111)
Creatinine, Ser: 0.64 mg/dL (ref 0.61–1.24)
GFR, Estimated: >60 mL/min
Glucose, Bld: 97 mg/dL (ref 70–99)
Potassium: 3.8 mmol/L (ref 3.5–5.1)
Sodium: 134 mmol/L — ABNORMAL LOW (ref 135–145)

## 2022-09-11 LAB — CBC
HCT: 40.6 % (ref 39.0–52.0)
Hemoglobin: 13.8 g/dL (ref 13.0–17.0)
MCH: 30.3 pg (ref 26.0–34.0)
MCHC: 34 g/dL (ref 30.0–36.0)
MCV: 89.2 fL (ref 80.0–100.0)
Platelets: 403 10*3/uL — ABNORMAL HIGH (ref 150–400)
RBC: 4.55 MIL/uL (ref 4.22–5.81)
RDW: 11.8 % (ref 11.5–15.5)
WBC: 9 10*3/uL (ref 4.0–10.5)
nRBC: 0 % (ref 0.0–0.2)

## 2022-09-11 SURGERY — IRRIGATION AND DEBRIDEMENT HIP
Anesthesia: General | Site: Hip | Laterality: Left

## 2022-09-11 MED ORDER — DEXMEDETOMIDINE HCL IN NACL 80 MCG/20ML IV SOLN
INTRAVENOUS | Status: DC | PRN
  Administered 2022-09-11 (×5): 8 ug via BUCCAL

## 2022-09-11 MED ORDER — HEPARIN SODIUM (PORCINE) 5000 UNIT/ML IJ SOLN
5000.0000 [IU] | Freq: Three times a day (TID) | INTRAMUSCULAR | Status: DC
  Filled 2022-09-11: qty 1

## 2022-09-11 MED ORDER — LIDOCAINE HCL (CARDIAC) PF 100 MG/5ML IV SOSY
PREFILLED_SYRINGE | INTRAVENOUS | Status: DC | PRN
  Administered 2022-09-11: 100 mg via INTRAVENOUS

## 2022-09-11 MED ORDER — HYDROMORPHONE HCL 1 MG/ML IJ SOLN: 0.2500 mg | INTRAMUSCULAR | Status: DC | PRN

## 2022-09-11 MED ORDER — HYDROMORPHONE HCL 1 MG/ML IJ SOLN
0.5000 mg | INTRAMUSCULAR | Status: DC | PRN
  Administered 2022-09-11 – 2022-09-12 (×5): 0.5 mg via INTRAVENOUS
  Filled 2022-09-11 (×6): qty 0.5

## 2022-09-11 MED ORDER — ACETAMINOPHEN 10 MG/ML IV SOLN
INTRAVENOUS | Status: DC | PRN
  Administered 2022-09-11: 1000 mg via INTRAVENOUS

## 2022-09-11 MED ORDER — ACETAMINOPHEN 10 MG/ML IV SOLN
INTRAVENOUS | Status: AC
  Filled 2022-09-11: qty 100

## 2022-09-11 MED ORDER — FENTANYL CITRATE (PF) 100 MCG/2ML IJ SOLN
INTRAMUSCULAR | Status: DC | PRN
  Administered 2022-09-11 (×2): 50 ug via INTRAVENOUS

## 2022-09-11 MED ORDER — ZOLPIDEM TARTRATE 5 MG PO TABS
5.0000 mg | ORAL_TABLET | Freq: Every evening | ORAL | Status: DC | PRN
  Administered 2022-09-11: 5 mg via ORAL
  Filled 2022-09-11: qty 1

## 2022-09-11 MED ORDER — DIPHENHYDRAMINE HCL 12.5 MG/5ML PO ELIX: 12.5000 mg | ORAL_SOLUTION | ORAL | Status: DC | PRN

## 2022-09-11 MED ORDER — HYDROMORPHONE HCL 1 MG/ML IJ SOLN
INTRAMUSCULAR | Status: AC
  Filled 2022-09-11: qty 1

## 2022-09-11 MED ORDER — KETAMINE HCL 50 MG/5ML IJ SOSY
PREFILLED_SYRINGE | INTRAMUSCULAR | Status: AC
  Filled 2022-09-11: qty 5

## 2022-09-11 MED ORDER — MIDAZOLAM HCL 2 MG/2ML IJ SOLN
INTRAMUSCULAR | Status: DC | PRN
  Administered 2022-09-11: 2 mg via INTRAVENOUS

## 2022-09-11 MED ORDER — OXYCODONE HCL 5 MG PO TABS
ORAL_TABLET | ORAL | Status: AC
  Filled 2022-09-11: qty 1

## 2022-09-11 MED ORDER — ONDANSETRON HCL 4 MG/2ML IJ SOLN
INTRAMUSCULAR | Status: AC
  Filled 2022-09-11: qty 2

## 2022-09-11 MED ORDER — KETAMINE HCL 50 MG/ML IJ SOLN
INTRAMUSCULAR | Status: DC | PRN
  Administered 2022-09-11 (×2): 25 mg via INTRAMUSCULAR

## 2022-09-11 MED ORDER — LACTATED RINGERS IV SOLN: INTRAVENOUS | Status: DC | PRN

## 2022-09-11 MED ORDER — METOCLOPRAMIDE HCL 5 MG PO TABS: 5.0000 mg | ORAL_TABLET | Freq: Three times a day (TID) | ORAL | Status: DC | PRN

## 2022-09-11 MED ORDER — ONDANSETRON HCL 4 MG/2ML IJ SOLN
INTRAMUSCULAR | Status: DC | PRN
  Administered 2022-09-11: 4 mg via INTRAVENOUS

## 2022-09-11 MED ORDER — DOCUSATE SODIUM 100 MG PO CAPS
100.0000 mg | ORAL_CAPSULE | Freq: Two times a day (BID) | ORAL | Status: DC
  Administered 2022-09-11: 100 mg via ORAL
  Filled 2022-09-11 (×2): qty 1

## 2022-09-11 MED ORDER — METHOCARBAMOL 1000 MG/10ML IJ SOLN
500.0000 mg | Freq: Four times a day (QID) | INTRAVENOUS | Status: DC | PRN
  Filled 2022-09-11: qty 5

## 2022-09-11 MED ORDER — SODIUM CHLORIDE 0.9 % IR SOLN
Status: DC | PRN
  Administered 2022-09-11: 3000 mL

## 2022-09-11 MED ORDER — DEXMEDETOMIDINE HCL IN NACL 80 MCG/20ML IV SOLN
INTRAVENOUS | Status: AC
  Filled 2022-09-11: qty 20

## 2022-09-11 MED ORDER — OXYCODONE HCL 5 MG/5ML PO SOLN: 5.0000 mg | Freq: Once | ORAL | Status: AC | PRN

## 2022-09-11 MED ORDER — MIDAZOLAM HCL 2 MG/2ML IJ SOLN
INTRAMUSCULAR | Status: AC
  Filled 2022-09-11: qty 2

## 2022-09-11 MED ORDER — METHOCARBAMOL 500 MG PO TABS: 500.0000 mg | ORAL_TABLET | Freq: Four times a day (QID) | ORAL | Status: DC | PRN

## 2022-09-11 MED ORDER — CHLORHEXIDINE GLUCONATE CLOTH 2 % EX PADS: 6.0000 | MEDICATED_PAD | Freq: Every day | CUTANEOUS | Status: DC

## 2022-09-11 MED ORDER — OXYCODONE HCL 5 MG PO TABS
ORAL_TABLET | ORAL | Status: AC
  Administered 2022-09-11: 5 mg via ORAL
  Filled 2022-09-11: qty 1

## 2022-09-11 MED ORDER — OXYCODONE HCL 5 MG PO TABS
5.0000 mg | ORAL_TABLET | ORAL | Status: DC | PRN
  Administered 2022-09-11 – 2022-09-12 (×2): 5 mg via ORAL
  Filled 2022-09-11 (×2): qty 1

## 2022-09-11 MED ORDER — MUPIROCIN 2 % EX OINT
1.0000 | TOPICAL_OINTMENT | Freq: Two times a day (BID) | CUTANEOUS | Status: DC
  Administered 2022-09-11: 1 via NASAL
  Filled 2022-09-11: qty 22

## 2022-09-11 MED ORDER — METOCLOPRAMIDE HCL 5 MG/ML IJ SOLN: 5.0000 mg | Freq: Three times a day (TID) | INTRAMUSCULAR | Status: DC | PRN

## 2022-09-11 MED ORDER — ONDANSETRON HCL 4 MG/2ML IJ SOLN: 4.0000 mg | Freq: Four times a day (QID) | INTRAMUSCULAR | Status: DC | PRN

## 2022-09-11 MED ORDER — BUPIVACAINE-EPINEPHRINE (PF) 0.25% -1:200000 IJ SOLN
INTRAMUSCULAR | Status: DC | PRN
  Administered 2022-09-11: 30 mL via PERINEURAL

## 2022-09-11 MED ORDER — NEOMYCIN-POLYMYXIN B GU 40-200000 IR SOLN
Status: DC | PRN
  Administered 2022-09-11: 12 mL

## 2022-09-11 MED ORDER — PROPOFOL 10 MG/ML IV BOLUS
INTRAVENOUS | Status: AC
  Filled 2022-09-11: qty 20

## 2022-09-11 MED ORDER — 0.9 % SODIUM CHLORIDE (POUR BTL) OPTIME
TOPICAL | Status: DC | PRN
  Administered 2022-09-11: 1000 mL

## 2022-09-11 MED ORDER — SUCCINYLCHOLINE CHLORIDE 200 MG/10ML IV SOSY
PREFILLED_SYRINGE | INTRAVENOUS | Status: DC | PRN
  Administered 2022-09-11: 120 mg via INTRAVENOUS

## 2022-09-11 MED ORDER — FENTANYL CITRATE (PF) 100 MCG/2ML IJ SOLN
INTRAMUSCULAR | Status: AC
  Filled 2022-09-11: qty 2

## 2022-09-11 MED ORDER — SUCCINYLCHOLINE CHLORIDE 200 MG/10ML IV SOSY
PREFILLED_SYRINGE | INTRAVENOUS | Status: AC
  Filled 2022-09-11: qty 10

## 2022-09-11 MED ORDER — HYDROMORPHONE HCL 1 MG/ML IJ SOLN
INTRAMUSCULAR | Status: AC
  Administered 2022-09-11: 0.5 mg via INTRAVENOUS
  Filled 2022-09-11: qty 1

## 2022-09-11 MED ORDER — OXYCODONE HCL 5 MG PO TABS: 5.0000 mg | ORAL_TABLET | Freq: Once | ORAL | Status: AC | PRN

## 2022-09-11 MED ORDER — DEXAMETHASONE SODIUM PHOSPHATE 10 MG/ML IJ SOLN
INTRAMUSCULAR | Status: AC
  Filled 2022-09-11: qty 1

## 2022-09-11 MED ORDER — PROPOFOL 10 MG/ML IV BOLUS
INTRAVENOUS | Status: DC | PRN
  Administered 2022-09-11: 50 mg via INTRAVENOUS
  Administered 2022-09-11: 150 mg via INTRAVENOUS

## 2022-09-11 MED ORDER — HYDROMORPHONE HCL 1 MG/ML IJ SOLN
INTRAMUSCULAR | Status: DC | PRN
  Administered 2022-09-11 (×2): 1 mg via INTRAVENOUS

## 2022-09-11 MED ORDER — ONDANSETRON HCL 4 MG PO TABS: 4.0000 mg | ORAL_TABLET | Freq: Four times a day (QID) | ORAL | Status: DC | PRN

## 2022-09-11 MED ORDER — DEXAMETHASONE SODIUM PHOSPHATE 10 MG/ML IJ SOLN
INTRAMUSCULAR | Status: DC | PRN
  Administered 2022-09-11: 10 mg via INTRAVENOUS

## 2022-09-11 SURGICAL SUPPLY — 52 items
BLADE SAGITTAL WIDE XTHICK NO (BLADE) ×1 IMPLANT
BNDG COHESIVE 4X5 TAN STRL LF (GAUZE/BANDAGES/DRESSINGS) IMPLANT
BRUSH SCRUB EZ  4% CHG (MISCELLANEOUS) ×2
BRUSH SCRUB EZ 4% CHG (MISCELLANEOUS) ×2 IMPLANT
CHLORAPREP W/TINT 26 (MISCELLANEOUS) ×4 IMPLANT
DRAPE 3/4 80X56 (DRAPES) ×2 IMPLANT
DRAPE C-ARM 42X72 X-RAY (DRAPES) ×1 IMPLANT
DRAPE STERI IOBAN 125X83 (DRAPES) IMPLANT
DRAPE U-SHAPE 47X51 STRL (DRAPES) ×2 IMPLANT
DRSG AQUACEL AG ADV 3.5X 6 (GAUZE/BANDAGES/DRESSINGS) IMPLANT
DRSG AQUACEL AG ADV 3.5X10 (GAUZE/BANDAGES/DRESSINGS) IMPLANT
DRSG AQUACEL AG ADV 3.5X14 (GAUZE/BANDAGES/DRESSINGS) IMPLANT
DRSG TEGADERM 4X4.75 (GAUZE/BANDAGES/DRESSINGS) IMPLANT
DRSG XEROFORM 1X8 (GAUZE/BANDAGES/DRESSINGS) IMPLANT
ELECT REM PT RETURN 9FT ADLT (ELECTROSURGICAL) ×2
ELECTRODE REM PT RTRN 9FT ADLT (ELECTROSURGICAL) ×2 IMPLANT
GAUZE 4X4 16PLY ~~LOC~~+RFID DBL (SPONGE) ×2 IMPLANT
GAUZE XEROFORM 1X8 LF (GAUZE/BANDAGES/DRESSINGS) IMPLANT
GLOVE BIO SURGEON STRL SZ8 (GLOVE) ×4 IMPLANT
GLOVE BIOGEL PI IND STRL 8.5 (GLOVE) ×4 IMPLANT
GLOVE PI ORTHO PRO STRL SZ8 (GLOVE) ×4 IMPLANT
GOWN STRL REUS W/ TWL XL LVL3 (GOWN DISPOSABLE) ×4 IMPLANT
GOWN STRL REUS W/TWL XL LVL3 (GOWN DISPOSABLE) ×4
HOOD PEEL AWAY T7 (MISCELLANEOUS) ×3 IMPLANT
IV NS 250ML (IV SOLUTION) ×1
IV NS 250ML BAXH (IV SOLUTION) IMPLANT
IV NS IRRIG 3000ML ARTHROMATIC (IV SOLUTION) ×2 IMPLANT
JET LAVAGE IRRISEPT WOUND (IRRIGATION / IRRIGATOR) ×1
KIT PATIENT CARE HANA TABLE (KITS) ×2 IMPLANT
KIT TURNOVER CYSTO (KITS) ×2 IMPLANT
LAVAGE JET IRRISEPT WOUND (IRRIGATION / IRRIGATOR) IMPLANT
MANIFOLD NEPTUNE II (INSTRUMENTS) ×2 IMPLANT
MAT ABSORB  FLUID 56X50 GRAY (MISCELLANEOUS) ×1
MAT ABSORB FLUID 56X50 GRAY (MISCELLANEOUS) ×1 IMPLANT
NDL SAFETY ECLIP 18X1.5 (MISCELLANEOUS) IMPLANT
NDL SPNL 20GX3.5 QUINCKE YW (NEEDLE) ×2 IMPLANT
NEEDLE SPNL 20GX3.5 QUINCKE YW (NEEDLE) ×2 IMPLANT
PACK HIP PROSTHESIS (MISCELLANEOUS) ×1 IMPLANT
PADDING CAST BLEND 4X4 NS (MISCELLANEOUS) ×4 IMPLANT
PENCIL SMOKE EVACUATOR (MISCELLANEOUS) ×2 IMPLANT
PILLOW ABDUCTION MEDIUM (MISCELLANEOUS) ×2 IMPLANT
PULSAVAC PLUS IRRIG FAN TIP (DISPOSABLE) ×2
SPONGE DRAIN TRACH 4X4 STRL 2S (GAUZE/BANDAGES/DRESSINGS) IMPLANT
STAPLER SKIN PROX 35W (STAPLE) ×2 IMPLANT
SUT DVC 2 QUILL PDO  T11 36X36 (SUTURE) ×2
SUT DVC 2 QUILL PDO T11 36X36 (SUTURE) ×2 IMPLANT
SUT VIC AB 2-0 CT1 18 (SUTURE) ×2 IMPLANT
SYR 30ML LL (SYRINGE) ×2 IMPLANT
TIP FAN IRRIG PULSAVAC PLUS (DISPOSABLE) ×2 IMPLANT
TRAP FLUID SMOKE EVACUATOR (MISCELLANEOUS) ×2 IMPLANT
WAND WEREWOLF FASTSEAL 6.0 (MISCELLANEOUS) ×2 IMPLANT
WATER STERILE IRR 500ML POUR (IV SOLUTION) ×2 IMPLANT

## 2022-09-11 NOTE — Progress Notes (Signed)
Progress Note   Patient: Eric Clarke [id number] DOB: January 17, 1991 DOA: 09/10/2022     1 DOS: the patient was seen and examined on 09/11/2022   Brief hospital course: 32 y.o. male with medical history significant of IVDU, polysubstance abuse, who presents with pain in right groin and hip.   Patient states that his pain in left groin and left hip started this morning, which is constant, sharp, severe, nonradiating, aggravated by walking. Denies any injury. Says he was not able to ambulate and his friends had to carry him here. Denies testicular pain and urinary symptoms.  Denies back pain to me.  Patient does not have chest pain, cough, shortness.  Denies nausea, vomiting, diarrhea or abdominal pain. Pt states that he used meth 2 days ago.  CT scan showed moderate size left hip joint effusion.  2/11.  Patient declined left hip aspiration by interventional radiology yesterday.  In speaking with the patient today he was okay with surgical drainage.  Patient complaining of a lot of pain today.  Patient states he injects ICE (methamphetamine) in his blood.  Assessment and Plan: * Septic hip ([place]) Case discussed with Dr. Harlow Mares and he will take to the operating room for surgical drainage of his hip.  He will send off cultures.  Patient empirically on antibiotics at this point.  Pain control with IV and oral medications.  We likely will have a hard time controlling his pain with his history.  Acute metabolic encephalopathy Mental status better today.  Polysubstance abuse ([place]) Urine toxicology positive for amphetamines and opiates. HIV test negative.  Will send off hepatitis profiles tomorrow.  Tobacco abuse Nicotine patch  Hyponatremia Sodium 1 point less than the normal range.        Subjective: Patient yelling out in pain.  He points to the pain in his left groin and hip area.  Did not want me to touch his leg.  Coming in with septic hip.  Physical Exam: Vitals:   09/10/22  1600 09/10/22 1942 09/10/22 2017 09/11/22 0421  BP: 121/84 (!) 126/90 129/81 135/86  Pulse: 71 79 81 91  Resp: 12 18 16 16  $ Temp: 97.8 F (36.6 C) 97.9 F (36.6 C) 98.8 F (37.1 C) 99 F (37.2 C)  TempSrc:  Oral Oral Oral  SpO2: 97% 98% 99% 99%  Weight:   70.3 kg   Height:   5' 6"$  (1.676 m)    Physical Exam HENT:     Head: Normocephalic.     Mouth/Throat:     Pharynx: No oropharyngeal exudate.  Eyes:     General: Lids are normal.     Conjunctiva/sclera: Conjunctivae normal.  Cardiovascular:     Rate and Rhythm: Normal rate and regular rhythm.     Heart sounds: Normal heart sounds, S1 normal and S2 normal.  Pulmonary:     Breath sounds: No decreased breath sounds, wheezing, rhonchi or rales.  Abdominal:     Palpations: Abdomen is soft.     Tenderness: There is no abdominal tenderness.  Musculoskeletal:     Right lower leg: No swelling.     Left lower leg: No swelling.  Skin:    General: Skin is warm.     Findings: No rash.  Neurological:     Mental Status: He is alert and oriented to person, place, and time.     Comments: Able to flex at the left knee and left ankle.  Did not want to flex at the left hip.  Data Reviewed: Urine toxicology positive for amphetamines and opiates Sodium 134, CRP 2.0, white blood cell count 9.0, hemoglobin 13.8, platelet count 4 3   Disposition: Status is: Inpatient Remains inpatient appropriate because: Treating for septic hip with IV antibiotics  Planned Discharge Destination: Home    Time spent: 28 minutes Case discussed with Dr. Harlow Mares surgery. Author: [name], MD 09/11/2022 1:27 PM  For on call review [email or web address].

## 2022-09-11 NOTE — Anesthesia Postprocedure Evaluation (Signed)
Anesthesia Post Note  Patient: Eric Clarke  Procedure(s) Performed: IRRIGATION AND DEBRIDEMENT HIP (Left: Hip)  Patient location during evaluation: PACU Anesthesia Type: General Level of consciousness: awake and alert Pain management: pain level controlled Vital Signs Assessment: post-procedure vital signs reviewed and stable Respiratory status: spontaneous breathing, nonlabored ventilation, respiratory function stable and patient connected to nasal cannula oxygen Cardiovascular status: blood pressure returned to baseline and stable Postop Assessment: no apparent nausea or vomiting Anesthetic complications: no   No notable events documented.   Last Vitals:  Vitals:   09/11/22 1700 09/11/22 1715  BP: 118/77 124/74  Pulse: 65 73  Resp: 16 18  Temp: (!) 36.2 C   SpO2: 100% 100%    Last Pain:  Vitals:   09/11/22 1645  TempSrc:   PainSc: Asleep                 Ilene Qua

## 2022-09-11 NOTE — TOC Initial Note (Signed)
Transition of Care [facility]) - Initial/Assessment Note    Patient Details  Name: Eric Clarke MRN: [id number] Date of Birth: 25-Nov-1990  [facility] CM/SW Contact:    Candie Chroman, LCSW Phone Number: 09/11/2022, 12:50 PM  Clinical Narrative: CSW met with patient. No supports at bedside. CSW introduced role and inquired about not having a PCP or insurance which patient confirmed. Will provide PCP packet and intake paperwork for [facility] prior to discharge. No further concerns. CSW encouraged patient to contact CSW as needed. CSW will continue to follow patient for support and facilitate return home once stable. His mother will transport him home at discharge.                 Expected Discharge Plan: Home/Self Care Barriers to Discharge: Continued Medical Work up   Patient Goals and CMS Choice            Expected Discharge Plan and Services       Living arrangements for the past 2 months: Single Family Home                                      Prior Living Arrangements/Services Living arrangements for the past 2 months: Single Family Home   Patient language and need for interpreter reviewed:: Yes Do you feel safe going back to the place where you live?: Yes      Need for Family Participation in Patient Care: Yes (Comment)     Criminal Activity/Legal Involvement Pertinent to Current Situation/Hospitalization: No - Comment as needed  Activities of Daily Living Home Assistive Devices/Equipment: None ADL Screening (condition at time of admission) Patient's cognitive ability adequate to safely complete daily activities?: Yes Is the patient deaf or have difficulty hearing?: No Does the patient have difficulty seeing, even when wearing glasses/contacts?: No Does the patient have difficulty concentrating, remembering, or making decisions?: No Patient able to express need for assistance with ADLs?: Yes Does the patient have difficulty dressing  or bathing?: No Independently performs ADLs?: Yes (appropriate for developmental age) Does the patient have difficulty walking or climbing stairs?: No Weakness of Legs: Left Weakness of Arms/Hands: None  Permission Sought/Granted                  Emotional Assessment Appearance:: Appears stated age Attitude/Demeanor/Rapport: Engaged, Gracious Affect (typically observed): Accepting, Appropriate, Calm, Pleasant Orientation: : Oriented to Self, Oriented to Place, Oriented to  Time, Oriented to Situation Alcohol / Substance Use: Not Applicable Psych Involvement: No (comment)  Admission diagnosis:  Septic hip ([place]) [M00.9] Hip joint effusion, left [M25.452] Patient Active Problem List   Diagnosis Date Noted   Septic hip ([place]) 09/10/2022   Polysubstance abuse ([place]) 09/10/2022   Tobacco abuse [id number]   Acute metabolic encephalopathy [id number]   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  Phone: [phone] Fax: [place], [place] [place] [address] Phone: [phone] Fax: [phone]     Social Determinants of Health (SDOH) Social History: [place]: No Food Insecurity (09/10/2022)  Housing: Low Risk  (09/10/2022)  Transportation Needs: No Transportation Needs (09/10/2022)  Utilities: Not At Risk  (09/10/2022)  Tobacco Use: High Risk (09/10/2022)   SDOH Interventions:     Readmission Risk Interventions     No data to display

## 2022-09-11 NOTE — Progress Notes (Signed)
Per Lab patient is positive for MRSA by PCR.  MD made aware. This nurse contacted infection prevention on call [place]) to advice as patient shares a double room with another patient. Per IP patient is required to be in a single room and place on contact isolation upon return from surgery. MD [place] and charge nurse made aware.

## 2022-09-11 NOTE — Assessment & Plan Note (Signed)
Mental status better today.

## 2022-09-11 NOTE — Transfer of Care (Signed)
Immediate Anesthesia Transfer of Care Note  Patient: Eric Clarke  Procedure(s) Performed: IRRIGATION AND DEBRIDEMENT HIP (Left: Hip)  Patient Location: PACU  Anesthesia Type:MAC  Level of Consciousness: sedated  Airway & Oxygen Therapy: Patient Spontanous Breathing  Post-op Assessment: Report given to RN  Post vital signs: stable  Last Vitals:  Vitals Value Taken Time  BP 113/56 09/11/22 1630  Temp    Pulse 73 09/11/22 1633  Resp 17 09/11/22 1633  SpO2 96 % 09/11/22 1633  Vitals shown include unvalidated device data.  Last Pain:  Vitals:   09/11/22 1135  TempSrc:   PainSc: Asleep      Patients Stated Pain Goal: 5 ([id number])  Complications: No notable events documented.

## 2022-09-11 NOTE — Assessment & Plan Note (Addendum)
Case discussed with Dr. Harlow Mares and he will take to the operating room for surgical drainage of his hip.  He will send off cultures.  Patient empirically on antibiotics at this point.  Pain control with IV and oral medications.  We likely will have a hard time controlling his pain with his history.

## 2022-09-11 NOTE — Assessment & Plan Note (Addendum)
Urine toxicology positive for amphetamines and opiates. HIV test negative.  Will send off hepatitis profiles tomorrow.

## 2022-09-11 NOTE — Assessment & Plan Note (Signed)
Sodium 1 point less than the normal range.

## 2022-09-11 NOTE — Consult Note (Addendum)
ORTHOPAEDIC CONSULTATION  REQUESTING PHYSICIAN: [name], MD  Chief Complaint: left hip pain  HPI: Eric Clarke is a 32 y.o. male who complains of left hip pain for the past 2 days.  He has a medical history significant of IVDU, polysubstance abuse. The pain is sharp in character. The pain is severe and 10/10. The pain is worse with movement and better with rest. Denies any numbness, tingling or constitutional symptoms.  Past Medical History:  Diagnosis Date   Substance abuse [facility])    Past Surgical History:  Procedure Laterality Date   FRACTURE SURGERY     Social History   Socioeconomic History   Marital status: Single    Spouse name: Not on file   Number of children: Not on file   Years of education: Not on file   Highest education level: Not on file  Occupational History   Not on file  Tobacco Use   Smoking status: Every Day    Packs/day: 0.50    Types: Cigarettes   Smokeless tobacco: Current    Types: Chew  Vaping Use   Vaping Use: Never used  Substance and Sexual Activity   Alcohol use: Not Currently    Comment: occ   Drug use: Not Currently    Types: Methamphetamines, Marijuana, IV    Comment: herion, percocet, saboxone   Sexual activity: Yes  Other Topics Concern   Not on file  Social History Narrative   Not on file   Social Determinants of Health   Financial Resource Strain: Not on file  Food Insecurity: No Food Insecurity (09/10/2022)   Hunger Vital Sign    Worried About Running Out of Food in the Last Year: Never true    Ran Out of Food in the Last Year: Never true  Transportation Needs: No Transportation Needs (09/10/2022)   PRAPARE - Hydrologist (Medical): No    Lack of Transportation (Non-Medical): No  Physical Activity: Not on file  Stress: Not on file  Social Connections: Not on file   Family History  Problem Relation Age of Onset   Hypertension Father    Diabetes Father    Allergies   Allergen Reactions   Doxycycline Anaphylaxis   Prior to Admission medications   Not on File   CT HEAD WO CONTRAST (5MM)  Result Date: 09/10/2022 CLINICAL DATA:  Mental status change of unknown cause. EXAM: CT HEAD WITHOUT CONTRAST TECHNIQUE: Contiguous axial images were obtained from the base of the skull through the vertex without intravenous contrast. RADIATION DOSE REDUCTION: This exam was performed according to the departmental dose-optimization program which includes automated exposure control, adjustment of the mA and/or kV according to patient size and/or use of iterative reconstruction technique. COMPARISON:  02/23/2022. FINDINGS: Brain: No evidence of acute infarction, hemorrhage, hydrocephalus, extra-axial collection or mass lesion/mass effect. Vascular: No hyperdense vessel or unexpected calcification. Skull: Normal. Negative for fracture or focal lesion. Sinuses/Orbits: Globes and orbits are unremarkable. Minor scattered ethmoid and right maxillary sinus mucosal thickening. Mild left maxillary sinus mucosal thickening. Other: None. IMPRESSION: 1. No intracranial abnormality. Electronically Signed   By: Eric Clarke M.D.   On: 09/10/2022 17:14   CT FEMUR LEFT W CONTRAST  Result Date: 09/10/2022 CLINICAL DATA:  Soft tissue infection suspected, thigh, no prior imaging patient with lateral thigh pain out of proportion and IVDU, c/f myositis EXAM: CT OF THE LOWER LEFT EXTREMITY WITH CONTRAST TECHNIQUE: Multidetector CT imaging of the lower left extremity was performed  according to the standard protocol following intravenous contrast administration. RADIATION DOSE REDUCTION: This exam was performed according to the departmental dose-optimization program which includes automated exposure control, adjustment of the mA and/or kV according to patient size and/or use of iterative reconstruction technique. CONTRAST:  169m OMNIPAQUE IOHEXOL 300 MG/ML  SOLN COMPARISON:  Same-day x-ray, CT abdomen  pelvis 04/04/2020 FINDINGS: Bones/Joint/Cartilage No acute fracture or dislocation. Hip in knee joints are aligned with preservation of the joint space. Moderate-sized left hip joint effusion. No knee joint effusion. No erosions or periosteal elevation. Probable synovial herniation pits along the lateral margin of the left femur at the head-neck junction. No evidence of avascular necrosis. No lytic or sclerotic bone lesion. Ligaments Suboptimally assessed by CT. Muscles and Tendons Musculotendinous structures appear within normal limits by CT. No intramuscular fluid collection. No deep fascial fluid. Soft tissues Minimal subcutaneous edema lateral to the left hip at the level of the greater trochanter. No organized fluid collections. No soft tissue gas. No left inguinal lymphadenopathy. IMPRESSION: 1. Moderate-sized left hip joint effusion. Arthrocentesis is recommended to assess for septic arthritis given the patient history. 2. Minimal subcutaneous edema lateral to the left hip at the level of the greater trochanter. No organized fluid collections or soft tissue gas. 3. No acute osseous abnormality. Electronically Signed   By: Eric PokeD.O.   On: 09/10/2022 12:52   DG Femur Min 2 Views Left  Result Date: 09/10/2022 CLINICAL DATA:  Pain EXAM: LEFT FEMUR 4  VIEWS COMPARISON:  None Available. FINDINGS: There is no evidence of fracture or other focal bone lesions. Soft tissues are unremarkable. IMPRESSION: Negative. Electronically Signed   By: Eric BenchM.D.   On: 09/10/2022 11:25    Positive ROS: All other systems have been reviewed and were otherwise negative with the exception of those mentioned in the HPI and as above.  Physical Exam: General: Alert, no acute distress Cardiovascular: No pedal edema Respiratory: No cyanosis, no use of accessory musculature GI: No organomegaly, abdomen is soft and non-tender Skin: No lesions in the area of chief complaint Neurologic: Sensation intact  distally Psychiatric: Patient is competent for consent with normal mood and affect Lymphatic: No axillary or cervical lymphadenopathy  MUSCULOSKELETAL: pain with any movement of left hip. Tender anteriorly. Compartments soft. Good cap refill. Motor and sensory intact distally.  Assessment: Left hip effusion, likely septic arthritis  Plan: Patient has refused aspiration of his left hip. Given severity of pain, presence of large left hip joint effusion, elevated WBC despite antibiotic therapy, I did recommend irrigation and debridement through an anterior approach of his left hip.  The diagnosis, risks, benefits and alternatives to treatment are all discussed in detail with the patient. Risks include but are not limited to bleeding, persisitent infection, deep vein thrombosis, pulmonary embolism, nerve or vascular injury, repeat operation, persistent pain, weakness, stiffness and death. He understands and is eager to proceed.     JLovell Sheehan MD    09/11/2022 11:08 AM

## 2022-09-11 NOTE — Op Note (Signed)
09/11/2022  4:35 PM  PATIENT:  Eric Clarke   MRN: [id number]  PRE-OPERATIVE DIAGNOSIS:  Septic arthritis left hip   POST-OPERATIVE DIAGNOSIS: Same  Procedure: Left hip irrigation and debridement  Surgeon: Elyn Aquas. Harlow Mares, MD   Assist: none  Anesthesia: General  EBL: 25 mL   Specimens: Deep cultures hip fluid  Drains: Medium Hemovac deep    Description of the procedure in detail: After informed consent was obtained and the appropriate extremity marked in the pre-operative holding area, the patient was taken to the operating room and placed in the supine position on the fracture table. All pressure points were well padded and bilateral lower extremities were place in traction spars. The hip was prepped and draped in standard sterile fashion. A general anesthetic had been delivered by the anesthesia team. The skin and subcutaneous tissues were injected with a mixture of Marcaine with epinephrine for post-operative pain. A longitudinal incision approximately 10 cm in length was carried out from the anterior superior iliac spine to the greater trochanter. The tensor fascia was divided and blunt dissection was taken down to the level of the joint capsule. The lateral circumflex vessels were cauterized. Deep retractors were placed and a T shaped incision was carried out over the anterior capsule.   There was an immediate rush of green/yellow tinged fluid approximately 30 mL. This was send for gram stain, culture and sensitivity. The joint and wound was irrigated with GU laced normal saline. A drain was left deep to the hip capsule.  The tensor fascia closed with #2 Quill suture. The subcutaneous tissues were closed with 2-0 vicryl and staples for the skin. A sterile dressing was applied and an abduction pillow. Patient tolerated the procedure well and there were no apparent complication. Patient was taken to the recovery room in good condition.   Kurtis Bushman, MD

## 2022-09-11 NOTE — Assessment & Plan Note (Signed)
Nicotine patch  

## 2022-09-11 NOTE — Hospital Course (Signed)
32 y.o. male with medical history significant of IVDU, polysubstance abuse, who presents with pain in right groin and hip.   Patient states that his pain in left groin and left hip started this morning, which is constant, sharp, severe, nonradiating, aggravated by walking. Denies any injury. Says he was not able to ambulate and his friends had to carry him here. Denies testicular pain and urinary symptoms.  Denies back pain to me.  Patient does not have chest pain, cough, shortness.  Denies nausea, vomiting, diarrhea or abdominal pain. Pt states that he used meth 2 days ago.  CT scan showed moderate size left hip joint effusion.  2/11.  Patient declined left hip aspiration by interventional radiology yesterday.  In speaking with the patient today he was okay with surgical drainage.  Patient complaining of a lot of pain today.  Patient states he injects ICE (methamphetamine) in his blood.

## 2022-09-11 NOTE — Anesthesia Preprocedure Evaluation (Signed)
Anesthesia Evaluation  Patient identified by MRN, date of birth, ID band Patient awake    Reviewed: Allergy & Precautions, NPO status , Patient's Chart, lab work & pertinent test results  History of Anesthesia Complications Negative for: history of anesthetic complications  Airway Mallampati: II  TM Distance: >3 FB Neck ROM: full    Dental no notable dental hx.    Pulmonary neg pulmonary ROS, Current Smoker   Pulmonary exam normal        Cardiovascular Exercise Tolerance: Good negative cardio ROS Normal cardiovascular exam     Neuro/Psych negative neurological ROS  negative psych ROS   GI/Hepatic negative GI ROS,,,(+)     substance abuse  methamphetamine use and IV drug use  Endo/Other  negative endocrine ROS    Renal/GU      Musculoskeletal   Abdominal   Peds  Hematology negative hematology ROS (+)   Anesthesia Other Findings Past Medical History: No date: Substance abuse ([place])  Past Surgical History: No date: FRACTURE SURGERY  BMI    Body Mass Index: 25.02 kg/m      Reproductive/Obstetrics negative OB ROS                             Anesthesia Physical Anesthesia Plan  ASA: 2  Anesthesia Plan: General LMA   Post-op Pain Management: Ofirmev IV (intra-op)*, Dilaudid IV and Toradol IV (intra-op)*   Induction: Intravenous  PONV Risk Score and Plan: Dexamethasone, Ondansetron, Midazolam and Treatment may vary due to age or medical condition  Airway Management Planned: LMA  Additional Equipment:   Intra-op Plan:   Post-operative Plan: Extubation in OR  Informed Consent: I have reviewed the patients History and Physical, chart, labs and discussed the procedure including the risks, benefits and alternatives for the proposed anesthesia with the patient or authorized representative who has indicated his/her understanding and acceptance.     Dental Advisory  Given  Plan Discussed with: Anesthesiologist, CRNA and Surgeon  Anesthesia Plan Comments: (Patient consented for risks of anesthesia including but not limited to:  - adverse reactions to medications - damage to eyes, teeth, lips or other oral mucosa - nerve damage due to positioning  - sore throat or hoarseness - Damage to heart, brain, nerves, lungs, other parts of body or loss of life  Patient voiced understanding.)        Anesthesia Quick Evaluation

## 2022-09-11 NOTE — Anesthesia Procedure Notes (Signed)
Procedure Name: LMA Insertion Date/Time: 09/11/2022 3:13 PM  Performed by: Loree Fee, CRNAPre-anesthesia Checklist: Patient identified, Patient being monitored, Timeout performed, Emergency Drugs available and Suction available Patient Re-evaluated:Patient Re-evaluated prior to induction Oxygen Delivery Method: Circle system utilized Preoxygenation: Pre-oxygenation with 100% oxygen Induction Type: IV induction Ventilation: Mask ventilation without difficulty LMA: LMA inserted LMA Size: 4.0 Tube type: Oral Number of attempts: 1 Placement Confirmation: positive ETCO2 and breath sounds checked- equal and bilateral Tube secured with: Tape Dental Injury: Teeth and Oropharynx as per pre-operative assessment

## 2022-09-12 ENCOUNTER — Encounter: Payer: Self-pay | Admitting: Orthopedic Surgery

## 2022-09-12 LAB — BASIC METABOLIC PANEL WITH GFR
Anion gap: 8 (ref 5–15)
BUN: 16 mg/dL (ref 6–20)
CO2: 22 mmol/L (ref 22–32)
Calcium: 8.4 mg/dL — ABNORMAL LOW (ref 8.9–10.3)
Chloride: 104 mmol/L (ref 98–111)
Creatinine, Ser: 0.63 mg/dL (ref 0.61–1.24)
GFR, Estimated: >60 mL/min
Glucose, Bld: 123 mg/dL — ABNORMAL HIGH (ref 70–99)
Potassium: 3.8 mmol/L (ref 3.5–5.1)
Sodium: 134 mmol/L — ABNORMAL LOW (ref 135–145)

## 2022-09-12 LAB — HEPATITIS C ANTIBODY: HCV Ab: REACTIVE — AB

## 2022-09-12 LAB — CBC
HCT: 38.9 % — ABNORMAL LOW (ref 39.0–52.0)
Hemoglobin: 13.2 g/dL (ref 13.0–17.0)
MCH: 30 pg (ref 26.0–34.0)
MCHC: 33.9 g/dL (ref 30.0–36.0)
MCV: 88.4 fL (ref 80.0–100.0)
Platelets: 464 10*3/uL — ABNORMAL HIGH (ref 150–400)
RBC: 4.4 MIL/uL (ref 4.22–5.81)
RDW: 11.5 % (ref 11.5–15.5)
WBC: 16.1 10*3/uL — ABNORMAL HIGH (ref 4.0–10.5)
nRBC: 0 % (ref 0.0–0.2)

## 2022-09-12 LAB — HEPATITIS B SURFACE ANTIGEN: Hepatitis B Surface Ag: NONREACTIVE

## 2022-09-12 LAB — HEPATITIS B CORE ANTIBODY, TOTAL: Hep B Core Total Ab: NONREACTIVE

## 2022-09-12 NOTE — Discharge Summary (Addendum)
  Physician Discharge Summary   Patient: Eric Clarke MRN: [id number] DOB: 08-Sep-1990  Admit date:     09/10/2022  Discharge date: 09/12/22  Discharge Physician: Fritzi Mandes   PCP: Patient, No Pcp Per   Patient left AMA with IV peripheral and Hemovac left hip today. Unit director, AD, pt's family aware  Patient was admitted with left hip septic joint with history of IV drug abuse/polysubstance abuse. He underwent IND of his left hip. Was on IV antibiotics. Patient was getting IV PRN and PO pain meds. I was informed by patient's RN that he eloped the hospital around 12:30 PM.    NO charge  Signed: Fritzi Mandes, MD [facility] 09/12/2022

## 2022-09-12 NOTE — Progress Notes (Signed)
Asked to see patient But patient has eloped from the hospital

## 2022-09-12 NOTE — Progress Notes (Addendum)
Pharmacy Antibiotic Note  Eric Clarke is a 32 y.o. male admitted on 09/10/2022 with possible left septic hip joint. Pharmacy has been consulted for vancomycin and cefepime dosing.  Renal function is at baseline and Scr <0.8  Plan: Maintenance dose vancomycin 1250 mg q12h  - Goal AUC: 400-600 - Vd 0.72, IBW  - Scr 0.8 Estimated AUC 476.3, Cmin 11.7  Cefepime 2 grams q8h  Follow up plans for culture results.   Height: 5' 6"$  (167.6 cm) Weight: 70.3 kg (155 lb) IBW/kg (Calculated) : 63.8  Temp (24hrs), Avg:97.9 F (36.6 C), Min:97.1 F (36.2 C), Max:98.9 F (37.2 C)  Recent Labs  Lab 09/10/22 1059 09/11/22 0231 09/12/22 0633  WBC 11.9* 9.0 16.1*  CREATININE 0.75 0.64 0.63    Estimated Creatinine Clearance: 119.6 mL/min (by C-G formula based on SCr of 0.63 mg/dL).    Allergies  Allergen Reactions   Doxycycline Anaphylaxis    Antimicrobials this admission: vancomycin 2/10 >>  cefepime 2/10 >>   Dose adjustments this admission: Adjusted dose to 1261m IV q12h vancomycin  Microbiology results: 2/10 BCx: in process 2/10 Left hip synovial fluid: to be collected  Thank you for allowing pharmacy to be a part of this patient's care.  Needham Biggins 09/12/2022 11:25 AM

## 2022-09-12 NOTE — Progress Notes (Signed)
[place] [facility] [place] NAME: Eric Clarke    MR#:  [id number]  DATE OF BIRTH:  04-09-1991  SUBJECTIVE:  patient's father at bedside. He is sleeping however upon asking he tells me he wants [place] pain medicine at 10:45 AM. History of IV drug abuse and polysubstance abuse. It worked with physical therapy today.    VITALS:  Blood pressure (!) 137/90, pulse 74, temperature 98.9 F (37.2 C), resp. rate 18, height 5' 6"$  (1.676 m), weight 70.3 kg, SpO2 99 %.  PHYSICAL EXAMINATION:   GENERAL:  32 y.o.-year-old patient with no acute distress.  LUNGS: Normal breath sounds bilaterally, no wheezing CARDIOVASCULAR: S1, S2 normal. No murmur   ABDOMEN: Soft, nontender, nondistended. Bowel sounds present.  EXTREMITIES: No  edema b/l.   Left hip drain + NEUROLOGIC: nonfocal  patient is alert and awake SKIN: No obvious rash, lesion, or ulcer.   LABORATORY PANEL:  CBC Recent Labs  Lab 09/12/22 0633  WBC 16.1*  HGB 13.2  HCT 38.9*  PLT 464*    Chemistries  Recent Labs  Lab 09/10/22 1059 09/11/22 0231 09/12/22 0633  NA 134*   < > 134*  K 4.3   < > 3.8  CL 97*   < > 104  CO2 29   < > 22  GLUCOSE 99   < > 123*  BUN 19   < > 16  CREATININE 0.75   < > 0.63  CALCIUM 9.1   < > 8.4*  AST 61*  --   --   ALT 81*  --   --   ALKPHOS 77  --   --   BILITOT 1.0  --   --    < > = values in this interval not displayed.   Cardiac Enzymes No results for input(s): "TROPONINI" in the last 168 hours. RADIOLOGY:  CT HEAD WO CONTRAST (5MM)  Result Date: 09/10/2022 CLINICAL DATA:  Mental status change of unknown cause. EXAM: CT HEAD WITHOUT CONTRAST TECHNIQUE: Contiguous axial images were obtained from the base of the skull through the vertex without intravenous contrast. RADIATION DOSE REDUCTION: This exam was performed according to the departmental dose-optimization program which includes automated exposure control, adjustment of the mA and/or kV  according to patient size and/or use of iterative reconstruction technique. COMPARISON:  02/23/2022. FINDINGS: Brain: No evidence of acute infarction, hemorrhage, hydrocephalus, extra-axial collection or mass lesion/mass effect. Vascular: No hyperdense vessel or unexpected calcification. Skull: Normal. Negative for fracture or focal lesion. Sinuses/Orbits: Globes and orbits are unremarkable. Minor scattered ethmoid and right maxillary sinus mucosal thickening. Mild left maxillary sinus mucosal thickening. Other: None. IMPRESSION: 1. No intracranial abnormality. Electronically Signed   By: Lajean Manes M.D.   On: 09/10/2022 17:14   CT FEMUR LEFT W CONTRAST  Result Date: 09/10/2022 CLINICAL DATA:  Soft tissue infection suspected, thigh, no prior imaging patient with lateral thigh pain out of proportion and IVDU, c/f myositis EXAM: CT OF THE LOWER LEFT EXTREMITY WITH CONTRAST TECHNIQUE: Multidetector CT imaging of the lower left extremity was performed according to the standard protocol following intravenous contrast administration. RADIATION DOSE REDUCTION: This exam was performed according to the departmental dose-optimization program which includes automated exposure control, adjustment of the mA and/or kV according to patient size and/or use of iterative reconstruction technique. CONTRAST:  169m OMNIPAQUE IOHEXOL 300 MG/ML  SOLN COMPARISON:  Same-day x-ray, CT abdomen pelvis 04/04/2020 FINDINGS: Bones/Joint/Cartilage No acute fracture or dislocation. Hip in knee  joints are aligned with preservation of the joint space. Moderate-sized left hip joint effusion. No knee joint effusion. No erosions or periosteal elevation. Probable synovial herniation pits along the lateral margin of the left femur at the head-neck junction. No evidence of avascular necrosis. No lytic or sclerotic bone lesion. Ligaments Suboptimally assessed by CT. Muscles and Tendons Musculotendinous structures appear within normal limits by CT.  No intramuscular fluid collection. No deep fascial fluid. Soft tissues Minimal subcutaneous edema lateral to the left hip at the level of the greater trochanter. No organized fluid collections. No soft tissue gas. No left inguinal lymphadenopathy. IMPRESSION: 1. Moderate-sized left hip joint effusion. Arthrocentesis is recommended to assess for septic arthritis given the patient history. 2. Minimal subcutaneous edema lateral to the left hip at the level of the greater trochanter. No organized fluid collections or soft tissue gas. 3. No acute osseous abnormality. Electronically Signed   By: Davina Poke D.O.   On: 09/10/2022 12:52    32 y.o. male with medical history significant of IVDU, polysubstance abuse, who presents with pain in left  groin and hip.   Patient states that his pain in left groin and left hip pain  is constant, sharp, severe, nonradiating, aggravated by walking. Denies any injury. CT scan showed moderate size left hip joint effusion.   2/11.  Patient declined left hip aspiration by interventional radiology yesterday.  In speaking with the patient today he was okay with surgical drainage.  Patient complaining of a lot of pain today.  Patient states he injects ICE (methamphetamine) in his blood. 2/12 father at bedside. Pt asking for pain meds on the dot (time). Ambulated with PT well   Assessment and Plan: Septic Left hip ([place]) IVDA/polysubstance drug abuse --Case discussed with Dr. Harlow Mares and he will take to the operating room for surgical drainage of his hip.   -- Deep wound cultures No organism seen. Rare WBC -- patient empirically on antibiotics at this point.   --will have ID see pt regarding Abxs help --Pain control with IV and oral medications.  We likely will have a hard time controlling his pain with his history.   Acute metabolic encephalopathy Mental status better today.   Polysubstance abuse ([place]) --Urine toxicology positive for amphetamines and opiates. --HIV  test negative.    Tobacco abuse Nicotine patch   Hyponatremia Sodium 1 point less than the normal range.          Procedures: left hip I and D Family communication :father at bedside Consults :ID, ortho CODE STATUS: full DVT Prophylaxis :heparin (per RN--pt refusing it) Level of care: Med-Surg Status is: Inpatient Remains inpatient appropriate because: septic hip, IV abxs and ID consult    TOTAL TIME TAKING CARE OF THIS PATIENT: 35 minutes.  >50% time spent on counselling and coordination of care  Note: This dictation was prepared with Dragon dictation along with smaller phrase technology. Any transcriptional errors that result from this process are unintentional.  Fritzi Mandes M.D    [facility]   CC: Primary care physician; Patient, No Pcp Per

## 2022-09-12 NOTE — Evaluation (Signed)
Physical Therapy Evaluation Patient Details Name: Eric Clarke MRN: [id number] DOB: [birth year] Today's Date: 09/12/2022  History of Present Illness  Pt is a 32 y/o M admitted on 09/10/22 after presenting with c/o R groin & hip pain. CT scan showed moderate size left hip joint effusion. Pt underwent L hip I&D on 09/11/22. PMH: IVDU, polysubstance abuse  Clinical Impression  Pt seen for PT evaluation with pt's mother & father in room. Prior to admission pt was independent. Pt reports he will d/c to his mother's 1 level home with 1 step without rails to enter. On this date, pt endorses 8/10 pain in L hip but is able to ambulate independently in room without AD. Educated pt on recommendation to ambulate throughout the day with pt agreeable. Pt does not require acute PT services at this time. PT to complete current orders, please re-consult if new needs arise.       Recommendations for follow up therapy are one component of a multi-disciplinary discharge planning process, led by the attending physician.  Recommendations may be updated based on patient status, additional functional criteria and insurance authorization.  Follow Up Recommendations No PT follow up      Assistance Recommended at Discharge PRN  Patient can return home with the following       Equipment Recommendations None recommended by PT  Recommendations for Other Services       Functional Status Assessment Patient has had a recent decline in their functional status and demonstrates the ability to make significant improvements in function in a reasonable and predictable amount of time.     Precautions / Restrictions Precautions Precautions: None Restrictions Weight Bearing Restrictions: No      Mobility  Bed Mobility Overal bed mobility: Independent             General bed mobility comments: supine<>sit, HOB slightly elevated    Transfers Overall transfer level: Independent                  General transfer comment: STS from EOB    Ambulation/Gait Ambulation/Gait assistance: Independent Gait Distance (Feet): 18 Feet Assistive device: None Gait Pattern/deviations: Decreased weight shift to left, Decreased stance time - left Gait velocity: decreased        Stairs            Wheelchair Mobility    Modified Rankin (Stroke Patients Only)       Balance Overall balance assessment: Modified Independent   Sitting balance-Leahy Scale: Normal     Standing balance support: No upper extremity supported, During functional activity Standing balance-Leahy Scale: Good                               Pertinent Vitals/Pain Pain Assessment Pain Assessment: 0-10 Pain Score: 8  Pain Location: L hip Pain Descriptors / Indicators: Discomfort, Grimacing, Guarding Pain Intervention(s): Premedicated before session, Monitored during session    Home Living Family/patient expects to be discharged to:: Private residence Living Arrangements: Parent Available Help at Discharge: Family Type of Home: House Home Access: Stairs to enter Entrance Stairs-Rails: None Entrance Stairs-Number of Steps: 1   Home Layout: One level        Prior Function Prior Level of Function : Independent/Modified Independent                     Hand Dominance        Extremity/Trunk Assessment  Upper Extremity Assessment Upper Extremity Assessment: Overall WFL for tasks assessed    Lower Extremity Assessment Lower Extremity Assessment: Overall WFL for tasks assessed (LLE limited by pain)    Cervical / Trunk Assessment Cervical / Trunk Assessment: Normal  Communication   Communication: No difficulties  Cognition Arousal/Alertness: Awake/alert Behavior During Therapy: WFL for tasks assessed/performed, Flat affect Overall Cognitive Status: Within Functional Limits for tasks assessed                                          General Comments  General comments (skin integrity, edema, etc.): pt able to manage L hip JP drain during session without assist/cuing    Exercises     Assessment/Plan    PT Assessment Patient does not need any further PT services  PT Problem List         PT Treatment Interventions      PT Goals (Current goals can be found in the Care Plan section)  Acute Rehab PT Goals Patient Stated Goal: decreased pain PT Goal Formulation: With patient Time For Goal Achievement: 09/26/22 Potential to Achieve Goals: Good    Frequency       Co-evaluation               AM-PAC PT "6 Clicks" Mobility  Outcome Measure Help needed turning from your back to your side while in a flat bed without using bedrails?: None Help needed moving from lying on your back to sitting on the side of a flat bed without using bedrails?: None Help needed moving to and from a bed to a chair (including a wheelchair)?: None Help needed standing up from a chair using your arms (e.g., wheelchair or bedside chair)?: None Help needed to walk in hospital room?: None Help needed climbing 3-5 steps with a railing? : None 6 Click Score: 24    End of Session   Activity Tolerance: Patient limited by pain Patient left: in bed;with call bell/phone within reach;with family/visitor present Nurse Communication: Mobility status PT Visit Diagnosis: Pain;Other abnormalities of gait and mobility (R26.89);Difficulty in walking, not elsewhere classified (R26.2) Pain - Right/Left: Left Pain - part of body: Hip    Time: [id number] PT Time Calculation (min) (ACUTE ONLY): 8 min   Charges:   PT Evaluation $PT Eval Low Complexity: Eric Clarke, PT, DPT 09/12/22, 9:49 AM   Eric Clarke 09/12/2022, 9:48 AM

## 2022-09-12 NOTE — Progress Notes (Signed)
Patient left room and walked out of front door of hospital at 12:36 pm.  This time was note by security.  IV and Hemovac still in place.  Family is aware and Eric Clarke has spoken with patients mother.  AC notified. Dr. Posey Pronto and Dr. and Dr. Harlow Mares notified .

## 2022-09-13 LAB — HEPATITIS B SURFACE ANTIBODY, QUANTITATIVE: Hep B S AB Quant (Post): 129.1 m[IU]/mL (ref >9.9)

## 2022-09-15 LAB — CULTURE, BLOOD (ROUTINE X 2)
Culture: NO GROWTH
Culture: NO GROWTH
Special Requests: ADEQUATE
Special Requests: ADEQUATE

## 2022-09-17 LAB — AEROBIC/ANAEROBIC CULTURE W GRAM STAIN (SURGICAL/DEEP WOUND)
Culture: NO GROWTH
Culture: NO GROWTH
Gram Stain: NONE SEEN

## 2022-09-25 ENCOUNTER — Inpatient Hospital Stay
Admission: EM | Admit: 2022-09-25 | Discharge: 2022-09-26 | DRG: 548 | Discharge status: Against Medical Advice | Payer: Self-pay | Attending: Internal Medicine | Admitting: Internal Medicine

## 2022-09-25 ENCOUNTER — Inpatient Hospital Stay: Payer: Self-pay

## 2022-09-25 ENCOUNTER — Other Ambulatory Visit: Payer: Self-pay

## 2022-09-25 DIAGNOSIS — F17210 Nicotine dependence, cigarettes, uncomplicated: Secondary | ICD-10-CM | POA: Diagnosis present

## 2022-09-25 DIAGNOSIS — T43621A Poisoning by amphetamines, accidental (unintentional), initial encounter: Secondary | ICD-10-CM | POA: Diagnosis present

## 2022-09-25 DIAGNOSIS — Z881 Allergy status to other antibiotic agents status: Secondary | ICD-10-CM

## 2022-09-25 DIAGNOSIS — Z720 Tobacco use: Secondary | ICD-10-CM

## 2022-09-25 DIAGNOSIS — F1990 Other psychoactive substance use, unspecified, uncomplicated: Secondary | ICD-10-CM | POA: Diagnosis present

## 2022-09-25 DIAGNOSIS — M009 Pyogenic arthritis, unspecified: Principal | ICD-10-CM | POA: Diagnosis present

## 2022-09-25 DIAGNOSIS — F1910 Other psychoactive substance abuse, uncomplicated: Secondary | ICD-10-CM

## 2022-09-25 DIAGNOSIS — F1510 Other stimulant abuse, uncomplicated: Secondary | ICD-10-CM | POA: Diagnosis present

## 2022-09-25 DIAGNOSIS — G928 Other toxic encephalopathy: Secondary | ICD-10-CM | POA: Diagnosis present

## 2022-09-25 DIAGNOSIS — Z5329 Procedure and treatment not carried out because of patient's decision for other reasons: Secondary | ICD-10-CM | POA: Diagnosis present

## 2022-09-25 DIAGNOSIS — G9341 Metabolic encephalopathy: Secondary | ICD-10-CM | POA: Diagnosis present

## 2022-09-25 DIAGNOSIS — L03116 Cellulitis of left lower limb: Secondary | ICD-10-CM | POA: Diagnosis present

## 2022-09-25 LAB — CBC WITH DIFFERENTIAL/PLATELET
Abs Immature Granulocytes: 0.05 10*3/uL (ref 0.00–0.07)
Basophils Absolute: 0 10*3/uL (ref 0.0–0.1)
Basophils Relative: 0 %
Eosinophils Absolute: 0.7 10*3/uL — ABNORMAL HIGH (ref 0.0–0.5)
Eosinophils Relative: 8 %
HCT: 36.4 % — ABNORMAL LOW (ref 39.0–52.0)
Hemoglobin: 12.2 g/dL — ABNORMAL LOW (ref 13.0–17.0)
Immature Granulocytes: 1 %
Lymphocytes Relative: 31 %
Lymphs Abs: 2.8 10*3/uL (ref 0.7–4.0)
MCH: 30.7 pg (ref 26.0–34.0)
MCHC: 33.5 g/dL (ref 30.0–36.0)
MCV: 91.5 fL (ref 80.0–100.0)
Monocytes Absolute: 0.8 10*3/uL (ref 0.1–1.0)
Monocytes Relative: 8 %
Neutro Abs: 4.7 10*3/uL (ref 1.7–7.7)
Neutrophils Relative %: 52 %
Platelets: 374 10*3/uL (ref 150–400)
RBC: 3.98 MIL/uL — ABNORMAL LOW (ref 4.22–5.81)
RDW: 11.6 % (ref 11.5–15.5)
WBC: 9 10*3/uL (ref 4.0–10.5)
nRBC: 0 % (ref 0.0–0.2)

## 2022-09-25 LAB — COMPREHENSIVE METABOLIC PANEL
ALT: 47 U/L — ABNORMAL HIGH (ref 0–44)
AST: 38 U/L (ref 15–41)
Albumin: 3.5 g/dL (ref 3.5–5.0)
Alkaline Phosphatase: 63 U/L (ref 38–126)
Anion gap: 7 (ref 5–15)
BUN: 22 mg/dL — ABNORMAL HIGH (ref 6–20)
CO2: 28 mmol/L (ref 22–32)
Calcium: 8.6 mg/dL — ABNORMAL LOW (ref 8.9–10.3)
Chloride: 100 mmol/L (ref 98–111)
Creatinine, Ser: 0.82 mg/dL (ref 0.61–1.24)
GFR, Estimated: >60 mL/min (ref >60)
Glucose, Bld: 112 mg/dL — ABNORMAL HIGH (ref 70–99)
Potassium: 3.5 mmol/L (ref 3.5–5.1)
Sodium: 135 mmol/L (ref 135–145)
Total Bilirubin: 0.8 mg/dL (ref 0.3–1.2)
Total Protein: 6.6 g/dL (ref 6.5–8.1)

## 2022-09-25 LAB — SEDIMENTATION RATE: Sed Rate: 19 mm/hr — ABNORMAL HIGH (ref 0–15)

## 2022-09-25 LAB — LACTIC ACID, PLASMA: Lactic Acid, Venous: 1 mmol/L (ref 0.5–1.9)

## 2022-09-25 LAB — ETHANOL: Alcohol, Ethyl (B): <10 mg/dL (ref <10)

## 2022-09-25 LAB — C-REACTIVE PROTEIN: CRP: 3.5 mg/dL — ABNORMAL HIGH (ref <1.0)

## 2022-09-25 LAB — PROTIME-INR
INR: 1.1 (ref 0.8–1.2)
Prothrombin Time: 13.6 seconds (ref 11.4–15.2)

## 2022-09-25 LAB — APTT: aPTT: 31 seconds (ref 24–36)

## 2022-09-25 MED ORDER — KETOROLAC TROMETHAMINE 15 MG/ML IJ SOLN
15.0000 mg | Freq: Once | INTRAMUSCULAR | Status: AC
  Administered 2022-09-25: 15 mg via INTRAVENOUS
  Filled 2022-09-25: qty 1

## 2022-09-25 MED ORDER — KETOROLAC TROMETHAMINE 30 MG/ML IJ SOLN
30.0000 mg | Freq: Four times a day (QID) | INTRAMUSCULAR | Status: DC | PRN
  Administered 2022-09-25 – 2022-09-26 (×3): 30 mg via INTRAVENOUS
  Filled 2022-09-25 (×3): qty 1

## 2022-09-25 MED ORDER — SODIUM CHLORIDE 0.9 % IV SOLN: INTRAVENOUS | Status: DC

## 2022-09-25 MED ORDER — ACETAMINOPHEN 325 MG PO TABS: 650.0000 mg | ORAL_TABLET | Freq: Four times a day (QID) | ORAL | Status: DC | PRN

## 2022-09-25 MED ORDER — SODIUM CHLORIDE 0.9 % IV SOLN
2.0000 g | Freq: Three times a day (TID) | INTRAVENOUS | Status: DC
  Administered 2022-09-25 – 2022-09-26 (×3): 2 g via INTRAVENOUS
  Filled 2022-09-25 (×4): qty 12.5

## 2022-09-25 MED ORDER — SODIUM CHLORIDE 0.9 % IV BOLUS
1000.0000 mL | Freq: Once | INTRAVENOUS | Status: AC
  Administered 2022-09-25: 1000 mL via INTRAVENOUS

## 2022-09-25 MED ORDER — IOHEXOL 300 MG/ML  SOLN
100.0000 mL | Freq: Once | INTRAMUSCULAR | Status: AC | PRN
  Administered 2022-09-25: 100 mL via INTRAVENOUS

## 2022-09-25 MED ORDER — ONDANSETRON HCL 4 MG/2ML IJ SOLN: 4.0000 mg | Freq: Three times a day (TID) | INTRAMUSCULAR | Status: DC | PRN

## 2022-09-25 MED ORDER — NICOTINE 21 MG/24HR TD PT24
21.0000 mg | MEDICATED_PATCH | Freq: Every day | TRANSDERMAL | Status: DC
  Administered 2022-09-26: 21 mg via TRANSDERMAL
  Filled 2022-09-25: qty 1

## 2022-09-25 MED ORDER — VANCOMYCIN HCL 1250 MG/250ML IV SOLN
1250.0000 mg | Freq: Two times a day (BID) | INTRAVENOUS | Status: DC
  Administered 2022-09-25 – 2022-09-26 (×2): 1250 mg via INTRAVENOUS
  Filled 2022-09-25 (×2): qty 250

## 2022-09-25 MED ORDER — VANCOMYCIN HCL IN DEXTROSE 1-5 GM/200ML-% IV SOLN
1000.0000 mg | INTRAVENOUS | Status: AC
  Administered 2022-09-25: 1000 mg via INTRAVENOUS
  Filled 2022-09-25: qty 200

## 2022-09-25 MED ORDER — VANCOMYCIN HCL 1500 MG/300ML IV SOLN
1500.0000 mg | Freq: Once | INTRAVENOUS | Status: DC
  Filled 2022-09-25: qty 300

## 2022-09-25 MED ORDER — VANCOMYCIN HCL 500 MG/100ML IV SOLN
500.0000 mg | Freq: Once | INTRAVENOUS | Status: AC
  Administered 2022-09-25: 500 mg via INTRAVENOUS
  Filled 2022-09-25: qty 100

## 2022-09-25 MED ORDER — OXYCODONE-ACETAMINOPHEN 5-325 MG PO TABS: 1.0000 | ORAL_TABLET | ORAL | Status: DC | PRN

## 2022-09-25 NOTE — ED Notes (Signed)
Pt currently sleeping, easily aroused but falls back to sleep quickly.

## 2022-09-25 NOTE — ED Triage Notes (Signed)
Pt arrives from home via EMS w/ c/o possible infection and pain to L hip. Pt has surgery on L hip 2/10 to removed bone infection, left ama x2 days later. EMS endorsing infection is from hx IV drug use. Pt has not had any abx or treatment since he left 2/12.   AO4/GCS15 VSS Large reddened area noted to L hip with staples still in place from surgery.

## 2022-09-25 NOTE — Consult Note (Signed)
Pharmacy Antibiotic Note  Eric Clarke is a 32 y.o. male admitted on 09/25/2022 with septic hip joint. PMH significant for IVDU with methamphetamine. He was recently admitted to the hospital with septic arthritis of the left hip, underwent surgical I&D on 09/11/2022. He left the hospital by eloping on 09/12/2022 and removed his surgical drain at the time. He has not been taking any medications or antibiotics since then. He reports that he last used methamphetamines on 09/10/2022. Pharmacy has been consulted for vancomycin and cefepime dosing.  Plan: Day 1 of antibiotics Give vancomycin 1500 mg IV x1 followed by 1250 mg IV Q12H. Goal AUC 400-550. Expected AUC: 474.4 Expected Css min: 11.4 SCr used: 0.82  Weight used: IBW, Vd used: 0.72 (BMI 22.7) Start cefepime 2 g IV Q8H Continue to monitor renal function and follow culture results   Height: '5\' 8"'$  (172.7 cm) Weight: 68 kg (150 lb) IBW/kg (Calculated) : 68.4  Temp (24hrs), Avg:97.9 F (36.6 C), Min:97.9 F (36.6 C), Max:97.9 F (36.6 C)  Recent Labs  Lab 09/25/22 1034  WBC 9.0  CREATININE 0.82  LATICACIDVEN 1.0    Estimated Creatinine Clearance: 124.4 mL/min (by C-G formula based on SCr of 0.82 mg/dL).    Allergies  Allergen Reactions   Doxycycline Anaphylaxis    Antimicrobials this admission: 2/25 Vancomycin  >>  2/25 Cefepime >>   Dose adjustments this admission: N/A  Microbiology results: 2/25 BCx: IP  Thank you for allowing pharmacy to be a part of this patient's care.  Gretel Acre, PharmD PGY1 Pharmacy Resident 09/25/2022 12:06 PM

## 2022-09-25 NOTE — ED Provider Notes (Signed)
[facility] Provider Note    Event Date/Time   First MD Initiated Contact with Patient 09/25/22 1137     (approximate)   History   Chief Complaint: Wound Infection   HPI  Eric Clarke is a 32 y.o. male with a past history of IVDU with methamphetamine who was recently admitted to the hospital with septic arthritis of the left hip, underwent surgical incision and drainage on September 11, 2022.  Subsequently left the hospital by [place] on September 12, 2022.  He removed his surgical drain at the time.  He has not been taking any medications or antibiotics since then.  Reports that he last used methamphetamines on September 10, 2022.  Denies chest pain or shortness of breath but does complain of worsening pain redness and swelling at the left hip.  Denies any alcohol or drug use recently.     Physical Exam   Triage Vital Signs: ED Triage Vitals  Enc Vitals Group     BP 09/25/22 1024 (!) 146/100     Pulse Rate 09/25/22 1024 75     Resp 09/25/22 1024 16     Temp 09/25/22 1024 97.9 F (36.6 C)     Temp Source 09/25/22 1024 Oral     SpO2 09/25/22 1024 98 %     Weight 09/25/22 1024 150 lb (68 kg)     Height 09/25/22 1024 '5\' 8"'$  (1.727 m)     Head Circumference --      Peak Flow --      Pain Score 09/25/22 1031 10     Pain Loc --      Pain Edu? --      Excl. in [place]? --     Most recent vital signs: Vitals:   09/25/22 1055 09/25/22 1100  BP:  (!) 129/94  Pulse: 61 61  Resp: 18 11  Temp:    SpO2: 100% 100%    General: Awake, no distress.  Somewhat slurred speech CV:  Good peripheral perfusion.  Regular rate and rhythm.  No murmurs Resp:  Normal effort.  Clear to auscultation bilaterally Abd:  No distention.  Soft nontender Other:  Induration erythema warmth tenderness centered around his left lateral hip surgical incision and extending medially toward the groin.  No purulent drainage or crepitus.  Stapled incision is intact.   ED Results /  Procedures / Treatments   Labs (all labs ordered are listed, but only abnormal results are displayed) Labs Reviewed  COMPREHENSIVE METABOLIC PANEL - Abnormal; Notable for the following components:      Result Value   Glucose, Bld 112 (*)    BUN 22 (*)    Calcium 8.6 (*)    ALT 47 (*)    All other components within normal limits  CBC WITH DIFFERENTIAL/PLATELET - Abnormal; Notable for the following components:   RBC 3.98 (*)    Hemoglobin 12.2 (*)    HCT 36.4 (*)    Eosinophils Absolute 0.7 (*)    All other components within normal limits  CULTURE, BLOOD (ROUTINE X 2)  CULTURE, BLOOD (ROUTINE X 2)  ETHANOL  LACTIC ACID, PLASMA  URINE DRUG SCREEN, QUALITATIVE ([facility] ONLY)     EKG    RADIOLOGY    PROCEDURES:  Procedures   MEDICATIONS ORDERED IN ED: Medications  vancomycin (VANCOCIN) IVPB 1000 mg/200 mL premix (1,000 mg Intravenous New Bag/Given 09/25/22 1055)  sodium chloride 0.9 % bolus 1,000 mL (1,000 mLs Intravenous New Bag/Given 09/25/22 1039)  ketorolac (TORADOL) 15 MG/ML injection 15 mg (15 mg Intravenous Given 09/25/22 1039)     IMPRESSION / MDM / ASSESSMENT AND PLAN / ED COURSE  I reviewed the triage vital signs and the nursing notes.  DDx: Cellulitis, thigh abscess, AKI, electrolyte abnormality, intoxication  Patient's presentation is most consistent with acute presentation with potential threat to life or bodily function.  Patient presents with left thigh cellulitis in the setting of recent septic arthritis which was surgically drained, after which the patient eloped from the hospital.  He denies any alcohol or drug use, but appears to be somewhat intoxicated.  Will check ethanol level and UDS.  Review of previous culture data is positive for MRSA.  Will check labs and give IV vancomycin and plan to admit for continued management.  He is not septic.       FINAL CLINICAL IMPRESSION(S) / ED DIAGNOSES   Final diagnoses:  Cellulitis of left lower extremity      Rx / DC Orders   ED Discharge Orders     None        Note:  This document was prepared using Dragon voice recognition software and may include unintentional dictation errors.   Carrie Mew, MD 09/25/22 1145

## 2022-09-25 NOTE — H&P (Incomplete Revision)
History and Physical    Eric Clarke [id number] DOB: 10/01/1990 DOA: 09/25/2022  Referring MD/NP/PA:   PCP: Patient, No Pcp Per   Patient coming from:  The patient is coming from home.   Chief Complaint: left hip pain  HPI: Eric Clarke is a 32 y.o. male with medical history significant of f IVDU, polysubstance abuse, tobacco abuse, who presents with left hip pain.  Patient was recently hospitalized due to septic left hip joint,  Dr. Harlow Mares did surgical drainage of his left hip on 2/11. Deep wound cultures with no organism seen.  Unfortunately patient left hospital AMA.  He comes back with worsening left heel pain.  Left hip is erythematous, with staples still in place from previous surgery.  His pain is severe, constant, nonradiating.  Patient denies fever and chills.  No nausea vomiting, diarrhea or abdominal pain.  Denies chest pain, cough, shortness breath.  Of note, patient is very lethargic, falling asleep quickly during the interview.  He is arousable, when aroused, patient is oriented x 3.  He moves all extremities normally.  No facial droop or slurred speech.  Data reviewed independently and ED Course: pt was found to have WBC 9.0, GFR> 60, temperature normal, blood pressure 129/94, heart rate 80, RR 11, oxygen saturation 100% on room air.  Patient is admitted to [facility] bed as inpatient.  Dr. Sharlet Salina of Ortho is consulted.   EKG:  Not done in ED, will get one.     Review of Systems:   General: no fevers, chills, no body weight gain, fatigue HEENT: no blurry vision, hearing changes or sore throat Respiratory: no dyspnea, coughing, wheezing CV: no chest pain, no palpitations GI: no nausea, vomiting, abdominal pain, diarrhea, constipation GU: no dysuria, burning on urination, increased urinary frequency, hematuria  Ext: no leg edema Neuro: no unilateral weakness, numbness, or tingling, no vision change or hearing loss. Has lethargy Skin: no rash, no skin  tear. MSK: has left hip pain Heme: No easy bruising.  Travel history: No recent long distant travel.   Allergy:  Allergies  Allergen Reactions   Doxycycline Anaphylaxis    Past Medical History:  Diagnosis Date   Substance abuse [facility])     Past Surgical History:  Procedure Laterality Date   FRACTURE SURGERY     INCISION AND DRAINAGE HIP Left 09/11/2022   Procedure: IRRIGATION AND DEBRIDEMENT HIP;  Surgeon: Lovell Sheehan, MD;  Location: [facility] ORS;  Service: Orthopedics;  Laterality: Left;    Social History:  reports that he has been smoking cigarettes. He has been smoking an average of .5 packs per day. His smokeless tobacco use includes chew. He reports that he does not currently use alcohol. He reports that he does not currently use drugs after having used the following drugs: Methamphetamines, Marijuana, and IV.  Family History:  Family History  Problem Relation Age of Onset   Hypertension Father    Diabetes Father      Prior to Admission medications   Not on File    Physical Exam: Vitals:   09/25/22 1030 09/25/22 1055 09/25/22 1100 09/25/22 1130  BP: (!) 146/100  (!) 129/94 128/85  Pulse: 80 61 61 64  Resp: '15 18 11   '$ Temp:      TempSrc:      SpO2: 99% 100% 100% 100%  Weight:      Height:       General: Not in acute distress HEENT:       Eyes:  PERRL, EOMI, no scleral icterus.       ENT: No discharge from the ears and nose, no pharynx injection, no tonsillar enlargement.        Neck: No JVD, no bruit, no mass felt. Heme: No neck lymph node enlargement. Cardiac: S1/S2, RRR, No murmurs, No gallops or rubs. Respiratory: No rales, wheezing, rhonchi or rubs. GI: Soft, nondistended, nontender, no organomegaly, BS present. GU: No hematuria Ext: No pitting leg edema bilaterally. 1+DP/PT pulse bilaterally. Musculoskeletal: Left hip is indurated, tender, mildly swelling, erythematous, warm.  Has staples in place from previous surgery.    Skin: No rashes.  Neuro:  patient is very lethargic, falling asleep quickly during the interview.  Pt is arousable, when aroused, patient is oriented x 3. Cranial nerves II-XII grossly intact, moves all extremities. Psych: Patient is not psychotic, no suicidal or hemocidal ideation.  Labs on Admission: I have personally reviewed following labs and imaging studies  CBC: Recent Labs  Lab 09/25/22 1034  WBC 9.0  NEUTROABS 4.7  HGB 12.2*  HCT 36.4*  MCV 91.5  PLT [id number]   Basic Metabolic Panel: Recent Labs  Lab 09/25/22 1034  NA 135  K 3.5  CL 100  CO2 28  GLUCOSE 112*  BUN 22*  CREATININE 0.82  CALCIUM 8.6*   GFR: Estimated Creatinine Clearance: 124.4 mL/min (by C-G formula based on SCr of 0.82 mg/dL). Liver Function Tests: Recent Labs  Lab 09/25/22 1034  AST 38  ALT 47*  ALKPHOS 63  BILITOT 0.8  PROT 6.6  ALBUMIN 3.5   No results for input(s): "LIPASE", "AMYLASE" in the last 168 hours. No results for input(s): "AMMONIA" in the last 168 hours. Coagulation Profile: Recent Labs  Lab 09/25/22 1034  INR 1.1   Cardiac Enzymes: No results for input(s): "CKTOTAL", "CKMB", "CKMBINDEX", "TROPONINI" in the last 168 hours. BNP (last 3 results) No results for input(s): "PROBNP" in the last 8760 hours. HbA1C: No results for input(s): "HGBA1C" in the last 72 hours. CBG: No results for input(s): "GLUCAP" in the last 168 hours. Lipid Profile: No results for input(s): "CHOL", "HDL", "LDLCALC", "TRIG", "CHOLHDL", "LDLDIRECT" in the last 72 hours. Thyroid Function Tests: No results for input(s): "TSH", "T4TOTAL", "FREET4", "T3FREE", "THYROIDAB" in the last 72 hours. Anemia Panel: No results for input(s): "VITAMINB12", "FOLATE", "FERRITIN", "TIBC", "IRON", "RETICCTPCT" in the last 72 hours. Urine analysis:    Component Value Date/Time   COLORURINE YELLOW (A) 05/30/2019 1734   APPEARANCEUR HAZY (A) 05/30/2019 1734   LABSPEC 1.024 05/30/2019 1734   PHURINE 5.0 05/30/2019 1734   GLUCOSEU NEGATIVE  05/30/2019 1734   HGBUR NEGATIVE 05/30/2019 [place] 05/30/2019 [place] 05/30/2019 1734   PROTEINUR 30 (A) 05/30/2019 1734   NITRITE NEGATIVE 05/30/2019 1734   LEUKOCYTESUR NEGATIVE 05/30/2019 1734   Sepsis Labs: '@LABRCNTIP'$ (procalcitonin:4,lacticidven:4) )No results found for this or any previous visit (from the past 240 hour(s)).   Radiological Exams on Admission: No results found.    Assessment/Plan Principal Problem:   Septic arthritis of hip (HCC) Active Problems:   Polysubstance abuse (HCC)   Tobacco abuse   Acute metabolic encephalopathy   Assessment and Plan:  Septic left hip [facility]): Patient was recently hospitalized due to septic left hip joint. Dr. Harlow Mares did surgical drainage of his left hip on 2/11. Deep wound cultures with no organism seen.  Unfortunately patient left hospital AMA. Now pt comes back with worsening pain.  No fever or leukocytosis.  Clinically not septic.  Consulted Dr. Sharlet Salina of Ortho.  -Admit to MedSurg bed as inpatient -Antibiotics: Vancomycin and cefepime -Blood culture -As needed Tylenol and ketorolac IV for pain control -Will avoid narcotics since patient is very lethargic -f/u CT-left hip  Addendum: CT-left hip showed fluid collection in anterior compartment of the proximal left thigh anterior to the hip joint as below.  -will order aspiration per IR   CT-left hip 1. Partially rim-enhancing fluid collection within the anterior compartment of the proximal left thigh anterior to the hip joint measuring approximately 8.0 x 3.5 x 4.3 cm. Collection is predominantly located within the fascial space between the rectus femoris and tensor fascia lata muscles. There also appears to be a intramuscular component involving the proximal aspect of the vastus intermedius muscle. Differential includes postoperative seroma, hematoma, or developing abscess. 2. Skin thickening and ill-defined within the soft  tissues overlying the lateral aspect of the hip and proximal thigh. Small rim enhancing collection in the superficial soft tissues underlying the surgical staples measuring approximately 4.0 x 0.8 x 2.5 cm, suspicious for cellulitis with abscess. 3. No CT evidence of osteomyelitis. No appreciable left hip joint effusion to suggest septic arthritis.   Acute metabolic encephalopathy: Etiology is not clear.  Possibly due to drug abuse, but not very sure if pt had fall.  -Avoid narcotic now -Fall precaution, frequent neurochecks -UDS -f/u CT-head --> negative   Polysubstance abuse (HCC) and Tobacco abuse: -check UDS -Nicotine patch        DVT ppx: SCD  Code Status: Full code  Family Communication: not done, no family member is at bed side.     Disposition Plan:  Anticipate discharge back to previous environment  Consults called:  Dr. Sharlet Salina of Ortho is consulted.  Admission status and Level of care: Med-Surg:   as inpt       Dispo: The patient is from: Home              Anticipated d/c is to: Home              Anticipated d/c date is: 2 days              Patient currently is not medically stable to d/c.    Severity of Illness:  The appropriate patient status for this patient is INPATIENT. Inpatient status is judged to be reasonable and necessary in order to provide the required intensity of service to ensure the patient's safety. The patient's presenting symptoms, physical exam findings, and initial radiographic and laboratory data in the context of their chronic comorbidities is felt to place them at high risk for further clinical deterioration. Furthermore, it is not anticipated that the patient will be medically stable for discharge from the hospital within 2 midnights of admission.   * I certify that at the point of admission it is my clinical judgment that the patient will require inpatient hospital care spanning beyond 2 midnights from the point of admission due to  high intensity of service, high risk for further deterioration and high frequency of surveillance required.*       Date of Service 09/25/2022    Ivor Costa [facility]   If 7PM-7AM, please contact night-coverage [email or web address] 09/25/2022, 12:41 PM

## 2022-09-25 NOTE — Plan of Care (Signed)
  Problem: Activity: Goal: Risk for activity intolerance will decrease Outcome: Progressing   Problem: Pain Managment: Goal: General experience of comfort will improve Outcome: Progressing   

## 2022-09-25 NOTE — H&P (Addendum)
History and Physical    Eric Clarke [id number] DOB: 02-Jun-1991 DOA: 09/25/2022  Referring MD/NP/PA:   PCP: Patient, No Pcp Per   Patient coming from:  The patient is coming from home.   Chief Complaint: left hip pain  HPI: Eric Clarke is a 32 y.o. male with medical history significant of f IVDU, polysubstance abuse, tobacco abuse, who presents with left hip pain.  Patient was recently hospitalized due to septic left hip joint,  Dr. Harlow Mares did surgical drainage of his left hip on 2/11. Deep wound cultures with no organism seen.  Unfortunately patient left hospital AMA.  He comes back with worsening left heel pain.  Left hip is erythematous, with staples still in place from previous surgery.  His pain is severe, constant, nonradiating.  Patient denies fever and chills.  No nausea vomiting, diarrhea or abdominal pain.  Denies chest pain, cough, shortness breath.  Of note, patient is very lethargic, falling asleep quickly during the interview.  He is arousable, when aroused, patient is oriented x 3.  He moves all extremities normally.  No facial droop or slurred speech.  Data reviewed independently and ED Course: pt was found to have WBC 9.0, GFR> 60, temperature normal, blood pressure 129/94, heart rate 80, RR 11, oxygen saturation 100% on room air.  Patient is admitted to [facility] bed as inpatient.  Dr. Sharlet Salina of Ortho is consulted.   EKG:  Not done in ED, will get one.     Review of Systems:   General: no fevers, chills, no body weight gain, fatigue HEENT: no blurry vision, hearing changes or sore throat Respiratory: no dyspnea, coughing, wheezing CV: no chest pain, no palpitations GI: no nausea, vomiting, abdominal pain, diarrhea, constipation GU: no dysuria, burning on urination, increased urinary frequency, hematuria  Ext: no leg edema Neuro: no unilateral weakness, numbness, or tingling, no vision change or hearing loss. Has lethargy Skin: no rash, no skin  tear. MSK: has left hip pain Heme: No easy bruising.  Travel history: No recent long distant travel.   Allergy:  Allergies  Allergen Reactions   Doxycycline Anaphylaxis    Past Medical History:  Diagnosis Date   Substance abuse [facility])     Past Surgical History:  Procedure Laterality Date   FRACTURE SURGERY     INCISION AND DRAINAGE HIP Left 09/11/2022   Procedure: IRRIGATION AND DEBRIDEMENT HIP;  Surgeon: Lovell Sheehan, MD;  Location: [facility] ORS;  Service: Orthopedics;  Laterality: Left;    Social History:  reports that he has been smoking cigarettes. He has been smoking an average of .5 packs per day. His smokeless tobacco use includes chew. He reports that he does not currently use alcohol. He reports that he does not currently use drugs after having used the following drugs: Methamphetamines, Marijuana, and IV.  Family History:  Family History  Problem Relation Age of Onset   Hypertension Father    Diabetes Father      Prior to Admission medications   Not on File    Physical Exam: Vitals:   09/25/22 1130 09/25/22 1541 09/25/22 1611 09/25/22 2353  BP: 128/85 120/70 121/84 120/76  Pulse: 64 80 (!) 58 70  Resp:  '16 15 18  '$ Temp:   (!) 97.3 F (36.3 C) 98 F (36.7 C)  TempSrc:      SpO2: 100% 98% 97% 97%  Weight:      Height:       General: Not in acute distress HEENT:  Eyes: PERRL, EOMI, no scleral icterus.       ENT: No discharge from the ears and nose, no pharynx injection, no tonsillar enlargement.        Neck: No JVD, no bruit, no mass felt. Heme: No neck lymph node enlargement. Cardiac: S1/S2, RRR, No murmurs, No gallops or rubs. Respiratory: No rales, wheezing, rhonchi or rubs. GI: Soft, nondistended, nontender, no organomegaly, BS present. GU: No hematuria Ext: No pitting leg edema bilaterally. 1+DP/PT pulse bilaterally. Musculoskeletal: Left hip is indurated, tender, mildly swelling, erythematous, warm.  Has staples in place from previous  surgery.    Skin: No rashes.  Neuro: patient is very lethargic, falling asleep quickly during the interview.  Pt is arousable, when aroused, patient is oriented x 3. Cranial nerves II-XII grossly intact, moves all extremities. Psych: Patient is not psychotic, no suicidal or hemocidal ideation.  Labs on Admission: I have personally reviewed following labs and imaging studies  CBC: Recent Labs  Lab 09/25/22 1034 09/26/22 0409  WBC 9.0 7.2  NEUTROABS 4.7  --   HGB 12.2* 11.9*  HCT 36.4* 35.5*  MCV 91.5 91.5  PLT 374 [id number]   Basic Metabolic Panel: Recent Labs  Lab 09/25/22 1034 09/26/22 0409  NA 135 137  K 3.5 3.9  CL 100 106  CO2 28 24  GLUCOSE 112* 95  BUN 22* 17  CREATININE 0.82 0.67  CALCIUM 8.6* 7.7*   GFR: Estimated Creatinine Clearance: 127.5 mL/min (by C-G formula based on SCr of 0.67 mg/dL). Liver Function Tests: Recent Labs  Lab 09/25/22 1034  AST 38  ALT 47*  ALKPHOS 63  BILITOT 0.8  PROT 6.6  ALBUMIN 3.5   No results for input(s): "LIPASE", "AMYLASE" in the last 168 hours. No results for input(s): "AMMONIA" in the last 168 hours. Coagulation Profile: Recent Labs  Lab 09/25/22 1034  INR 1.1   Cardiac Enzymes: No results for input(s): "CKTOTAL", "CKMB", "CKMBINDEX", "TROPONINI" in the last 168 hours. BNP (last 3 results) No results for input(s): "PROBNP" in the last 8760 hours. HbA1C: No results for input(s): "HGBA1C" in the last 72 hours. CBG: No results for input(s): "GLUCAP" in the last 168 hours. Lipid Profile: No results for input(s): "CHOL", "HDL", "LDLCALC", "TRIG", "CHOLHDL", "LDLDIRECT" in the last 72 hours. Thyroid Function Tests: No results for input(s): "TSH", "T4TOTAL", "FREET4", "T3FREE", "THYROIDAB" in the last 72 hours. Anemia Panel: No results for input(s): "VITAMINB12", "FOLATE", "FERRITIN", "TIBC", "IRON", "RETICCTPCT" in the last 72 hours. Urine analysis:    Component Value Date/Time   COLORURINE YELLOW (A) 05/30/2019  1734   APPEARANCEUR HAZY (A) 05/30/2019 1734   LABSPEC 1.024 05/30/2019 1734   PHURINE 5.0 05/30/2019 1734   GLUCOSEU NEGATIVE 05/30/2019 1734   HGBUR NEGATIVE 05/30/2019 [place] 05/30/2019 [place] 05/30/2019 1734   PROTEINUR 30 (A) 05/30/2019 1734   NITRITE NEGATIVE 05/30/2019 1734   LEUKOCYTESUR NEGATIVE 05/30/2019 1734   Sepsis Labs: '@LABRCNTIP'$ (procalcitonin:4,lacticidven:4) ) Recent Results (from the past 240 hour(s))  Culture, blood (routine x 2)     Status: None (Preliminary result)   Collection Time: 09/25/22 10:34 AM   Specimen: BLOOD  Result Value Ref Range Status   Specimen Description BLOOD LEFT ARM  Final   Special Requests   Final    BOTTLES DRAWN AEROBIC AND ANAEROBIC Blood Culture results may not be optimal due to an excessive volume of blood received in culture bottles   Culture   Final    NO GROWTH <  66 HOURS Performed at [facility], [address] [place], [place] [address]    Report Status PENDING  Incomplete  Culture, blood (routine x 2)     Status: None (Preliminary result)   Collection Time: 09/25/22 10:34 AM   Specimen: BLOOD  Result Value Ref Range Status   Specimen Description BLOOD LEFT ARM  Final   Special Requests   Final    BOTTLES DRAWN AEROBIC AND ANAEROBIC Blood Culture adequate volume   Culture   Final    NO GROWTH < 24 HOURS Performed at [facility], [address], [place], [place] [address]    Report Status PENDING  Incomplete     Radiological Exams on Admission: CT HIP LEFT W CONTRAST  Result Date: 09/25/2022 CLINICAL DATA:  Osteomyelitis suspected, hip, no prior imaging Septic arthritis suspected, hip, no prior imaging. Open left hip debridement performed on 09/11/2022 EXAM: CT OF THE LOWER LEFT EXTREMITY WITH CONTRAST TECHNIQUE: Multidetector CT imaging of the lower left extremity was performed according to the standard protocol following intravenous contrast  administration. RADIATION DOSE REDUCTION: This exam was performed according to the departmental dose-optimization program which includes automated exposure control, adjustment of the mA and/or kV according to patient size and/or use of iterative reconstruction technique. CONTRAST:  142m OMNIPAQUE IOHEXOL 300 MG/ML  SOLN COMPARISON:  CT 09/10/2022 FINDINGS: Bones/Joint/Cartilage No acute fracture or dislocation. Left hip joint space is preserved. No appreciable left hip joint effusion. No erosion or periosteal elevation. Included portion of the left hemipelvis is intact. No lytic or sclerotic bone lesion. Ligaments Suboptimally assessed by CT. Muscles and Tendons Fluid collection within the anterior compartment of the proximal left thigh anterior to the hip joint measuring approximately 8.0 x 3.5 x 4.3 cm (series 5, image 48; series 8, image 31). Collection is partially rim enhancing. The collection is predominantly located within the fascial space between the rectus femoris and tensor fascia lata muscles. There also appears to be a intramuscular component involving the proximal aspect of the vastus intermedius muscle. No gas within the fluid collection. Soft tissues Skin thickening and ill-defined within the soft tissues overlying the lateral aspect of the hip and proximal thigh. There is a small rim enhancing collection in the superficial soft tissues underlying the surgical staples measuring approximately 4.0 x 0.8 x 2.5 cm (series 5, image 14). No soft tissue gas. No left inguinal lymphadenopathy. IMPRESSION: 1. Partially rim-enhancing fluid collection within the anterior compartment of the proximal left thigh anterior to the hip joint measuring approximately 8.0 x 3.5 x 4.3 cm. Collection is predominantly located within the fascial space between the rectus femoris and tensor fascia lata muscles. There also appears to be a intramuscular component involving the proximal aspect of the vastus intermedius muscle.  Differential includes postoperative seroma, hematoma, or developing abscess. 2. Skin thickening and ill-defined within the soft tissues overlying the lateral aspect of the hip and proximal thigh. Small rim enhancing collection in the superficial soft tissues underlying the surgical staples measuring approximately 4.0 x 0.8 x 2.5 cm, suspicious for cellulitis with abscess. 3. No CT evidence of osteomyelitis. No appreciable left hip joint effusion to suggest septic arthritis. Electronically Signed   By: NDavina PokeD.O.   On: 09/25/2022 13:07   CT HEAD WO CONTRAST (5MM)  Result Date: 09/25/2022 CLINICAL DATA:  Mental status change, unknown cause EXAM: CT HEAD WITHOUT CONTRAST TECHNIQUE: Contiguous axial images were obtained from the base of the skull through the vertex without intravenous contrast.  RADIATION DOSE REDUCTION: This exam was performed according to the departmental dose-optimization program which includes automated exposure control, adjustment of the mA and/or kV according to patient size and/or use of iterative reconstruction technique. COMPARISON:  09/10/2022 FINDINGS: Brain: No evidence of acute infarction, hemorrhage, hydrocephalus, extra-axial collection or mass lesion/mass effect. Vascular: No hyperdense vessel or unexpected calcification. Skull: Normal. Negative for fracture or focal lesion. Sinuses/Orbits: No acute finding. Other: None. IMPRESSION: No acute intracranial abnormality. Electronically Signed   By: Davina Poke D.O.   On: 09/25/2022 12:58      Assessment/Plan Principal Problem:   Septic arthritis of hip (HCC) Active Problems:   Polysubstance abuse (HCC)   Tobacco abuse   Acute metabolic encephalopathy   Assessment and Plan:  Septic left hip [facility]): Patient was recently hospitalized due to septic left hip joint. Dr. Harlow Mares did surgical drainage of his left hip on 2/11. Deep wound cultures with no organism seen.  Unfortunately patient left hospital AMA. Now pt  comes back with worsening pain.  No fever or leukocytosis.  Clinically not septic.  Consulted Dr. Sharlet Salina of Ortho.  -Admit to MedSurg bed as inpatient -Antibiotics: Vancomycin and cefepime -Blood culture -As needed Tylenol and ketorolac IV for pain control -Will avoid narcotics since patient is very lethargic -f/u CT-left hip  Addendum: CT-left hip showed fluid collection in anterior compartment of the proximal left thigh anterior to the hip joint as below.  -will order aspiration per IR   CT-left hip 1. Partially rim-enhancing fluid collection within the anterior compartment of the proximal left thigh anterior to the hip joint measuring approximately 8.0 x 3.5 x 4.3 cm. Collection is predominantly located within the fascial space between the rectus femoris and tensor fascia lata muscles. There also appears to be a intramuscular component involving the proximal aspect of the vastus intermedius muscle. Differential includes postoperative seroma, hematoma, or developing abscess. 2. Skin thickening and ill-defined within the soft tissues overlying the lateral aspect of the hip and proximal thigh. Small rim enhancing collection in the superficial soft tissues underlying the surgical staples measuring approximately 4.0 x 0.8 x 2.5 cm, suspicious for cellulitis with abscess. 3. No CT evidence of osteomyelitis. No appreciable left hip joint effusion to suggest septic arthritis.   Acute metabolic encephalopathy: Etiology is not clear.  Possibly due to drug abuse, but not very sure if pt had fall.  -Avoid narcotic now -Fall precaution, frequent neurochecks -UDS -f/u CT-head --> negative   Polysubstance abuse (HCC) and Tobacco abuse: -check UDS -Nicotine patch        DVT ppx: SCD  Code Status: Full code  Family Communication: not done, no family member is at bed side.     Disposition Plan:  Anticipate discharge back to previous environment  Consults called:  Dr. Sharlet Salina  of Ortho is consulted.  Admission status and Level of care: Med-Surg:   as inpt       Dispo: The patient is from: Home              Anticipated d/c is to: Home              Anticipated d/c date is: 2 days              Patient currently is not medically stable to d/c.    Severity of Illness:  The appropriate patient status for this patient is INPATIENT. Inpatient status is judged to be reasonable and necessary in order to provide the required intensity of service  to ensure the patient's safety. The patient's presenting symptoms, physical exam findings, and initial radiographic and laboratory data in the context of their chronic comorbidities is felt to place them at high risk for further clinical deterioration. Furthermore, it is not anticipated that the patient will be medically stable for discharge from the hospital within 2 midnights of admission.   * I certify that at the point of admission it is my clinical judgment that the patient will require inpatient hospital care spanning beyond 2 midnights from the point of admission due to high intensity of service, high risk for further deterioration and high frequency of surveillance required.*       Date of Service 09/26/2022    Ivor Costa [facility]   If 7PM-7AM, please contact night-coverage [email or web address] 09/26/2022, 7:06 AM

## 2022-09-26 LAB — BASIC METABOLIC PANEL
Anion gap: 7 (ref 5–15)
BUN: 17 mg/dL (ref 6–20)
CO2: 24 mmol/L (ref 22–32)
Calcium: 7.7 mg/dL — ABNORMAL LOW (ref 8.9–10.3)
Chloride: 106 mmol/L (ref 98–111)
Creatinine, Ser: 0.67 mg/dL (ref 0.61–1.24)
GFR, Estimated: >60 mL/min (ref >60)
Glucose, Bld: 95 mg/dL (ref 70–99)
Potassium: 3.9 mmol/L (ref 3.5–5.1)
Sodium: 137 mmol/L (ref 135–145)

## 2022-09-26 LAB — GLUCOSE, CAPILLARY
Glucose-Capillary: 113 mg/dL — ABNORMAL HIGH (ref 70–99)
Glucose-Capillary: 120 mg/dL — ABNORMAL HIGH (ref 70–99)

## 2022-09-26 LAB — CBC
HCT: 35.5 % — ABNORMAL LOW (ref 39.0–52.0)
Hemoglobin: 11.9 g/dL — ABNORMAL LOW (ref 13.0–17.0)
MCH: 30.7 pg (ref 26.0–34.0)
MCHC: 33.5 g/dL (ref 30.0–36.0)
MCV: 91.5 fL (ref 80.0–100.0)
Platelets: 340 10*3/uL (ref 150–400)
RBC: 3.88 MIL/uL — ABNORMAL LOW (ref 4.22–5.81)
RDW: 11.9 % (ref 11.5–15.5)
WBC: 7.2 10*3/uL (ref 4.0–10.5)
nRBC: 0 % (ref 0.0–0.2)

## 2022-09-26 LAB — URINE DRUG SCREEN, QUALITATIVE (ARMC ONLY)
Amphetamines, Ur Screen: POSITIVE — AB
Barbiturates, Ur Screen: NOT DETECTED
Benzodiazepine, Ur Scrn: NOT DETECTED
Cannabinoid 50 Ng, Ur ~~LOC~~: NOT DETECTED
Cocaine Metabolite,Ur ~~LOC~~: NOT DETECTED
MDMA (Ecstasy)Ur Screen: NOT DETECTED
Methadone Scn, Ur: NOT DETECTED
Opiate, Ur Screen: NOT DETECTED
Phencyclidine (PCP) Ur S: NOT DETECTED
Tricyclic, Ur Screen: NOT DETECTED

## 2022-09-26 SURGERY — INCISION AND DRAINAGE
Anesthesia: Choice | Laterality: Left

## 2022-09-26 NOTE — Assessment & Plan Note (Signed)
CT head negative.  Most likely secondary to drug abuse.  UDS positive for amphetamines.  Still little somnolent but easily arousable. -Continue to monitor

## 2022-09-26 NOTE — Assessment & Plan Note (Signed)
-  Nicotine patch 

## 2022-09-26 NOTE — Consult Note (Signed)
ORTHOPAEDIC CONSULTATION  REQUESTING PHYSICIAN: Lorella Nimrod, MD  Chief Complaint: Right hip pain and erythema  HPI: Eric Clarke is a 32 y.o. male who right hip I&D with Dr. Harlow Mares on 2/11 who left AMA, returning for erythema around the incision and worsening pain.  CT showed presence of small fluid collection concerning for abscess versus cellulitis versus postoperative change.  Orthopedics was consulted.  Past Medical History:  Diagnosis Date   Substance abuse [facility])    Past Surgical History:  Procedure Laterality Date   FRACTURE SURGERY     INCISION AND DRAINAGE HIP Left 09/11/2022   Procedure: IRRIGATION AND DEBRIDEMENT HIP;  Surgeon: Lovell Sheehan, MD;  Location: [facility] ORS;  Service: Orthopedics;  Laterality: Left;   Social History   Socioeconomic History   Marital status: Single    Spouse name: Not on file   Number of children: Not on file   Years of education: Not on file   Highest education level: Not on file  Occupational History   Not on file  Tobacco Use   Smoking status: Every Day    Packs/day: 0.50    Types: Cigarettes   Smokeless tobacco: Current    Types: Chew  Vaping Use   Vaping Use: Never used  Substance and Sexual Activity   Alcohol use: Not Currently    Comment: occ   Drug use: Not Currently    Types: Methamphetamines, Marijuana, IV    Comment: herion, percocet, saboxone   Sexual activity: Yes  Other Topics Concern   Not on file  Social History Narrative   Not on file   Social Determinants of Health   Financial Resource Strain: Not on file  Food Insecurity: No Food Insecurity (09/25/2022)   Hunger Vital Sign    Worried About Running Out of Food in the Last Year: Never true    Ran Out of Food in the Last Year: Never true  Transportation Needs: No Transportation Needs (09/25/2022)   PRAPARE - Hydrologist (Medical): No    Lack of Transportation (Non-Medical): No  Physical Activity: Not on file  Stress:  Not on file  Social Connections: Not on file   Family History  Problem Relation Age of Onset   Hypertension Father    Diabetes Father    Allergies  Allergen Reactions   Doxycycline Anaphylaxis   Prior to Admission medications   Not on File   CT HIP LEFT W CONTRAST  Result Date: 09/25/2022 CLINICAL DATA:  Osteomyelitis suspected, hip, no prior imaging Septic arthritis suspected, hip, no prior imaging. Open left hip debridement performed on 09/11/2022 EXAM: CT OF THE LOWER LEFT EXTREMITY WITH CONTRAST TECHNIQUE: Multidetector CT imaging of the lower left extremity was performed according to the standard protocol following intravenous contrast administration. RADIATION DOSE REDUCTION: This exam was performed according to the departmental dose-optimization program which includes automated exposure control, adjustment of the mA and/or kV according to patient size and/or use of iterative reconstruction technique. CONTRAST:  18m OMNIPAQUE IOHEXOL 300 MG/ML  SOLN COMPARISON:  CT 09/10/2022 FINDINGS: Bones/Joint/Cartilage No acute fracture or dislocation. Left hip joint space is preserved. No appreciable left hip joint effusion. No erosion or periosteal elevation. Included portion of the left hemipelvis is intact. No lytic or sclerotic bone lesion. Ligaments Suboptimally assessed by CT. Muscles and Tendons Fluid collection within the anterior compartment of the proximal left thigh anterior to the hip joint measuring approximately 8.0 x 3.5 x 4.3 cm (series 5, image  48; series 8, image 31). Collection is partially rim enhancing. The collection is predominantly located within the fascial space between the rectus femoris and tensor fascia lata muscles. There also appears to be a intramuscular component involving the proximal aspect of the vastus intermedius muscle. No gas within the fluid collection. Soft tissues Skin thickening and ill-defined within the soft tissues overlying the lateral aspect of the hip  and proximal thigh. There is a small rim enhancing collection in the superficial soft tissues underlying the surgical staples measuring approximately 4.0 x 0.8 x 2.5 cm (series 5, image 14). No soft tissue gas. No left inguinal lymphadenopathy. IMPRESSION: 1. Partially rim-enhancing fluid collection within the anterior compartment of the proximal left thigh anterior to the hip joint measuring approximately 8.0 x 3.5 x 4.3 cm. Collection is predominantly located within the fascial space between the rectus femoris and tensor fascia lata muscles. There also appears to be a intramuscular component involving the proximal aspect of the vastus intermedius muscle. Differential includes postoperative seroma, hematoma, or developing abscess. 2. Skin thickening and ill-defined within the soft tissues overlying the lateral aspect of the hip and proximal thigh. Small rim enhancing collection in the superficial soft tissues underlying the surgical staples measuring approximately 4.0 x 0.8 x 2.5 cm, suspicious for cellulitis with abscess. 3. No CT evidence of osteomyelitis. No appreciable left hip joint effusion to suggest septic arthritis. Electronically Signed   By: Davina Poke D.O.   On: 09/25/2022 13:07   CT HEAD WO CONTRAST (5MM)  Result Date: 09/25/2022 CLINICAL DATA:  Mental status change, unknown cause EXAM: CT HEAD WITHOUT CONTRAST TECHNIQUE: Contiguous axial images were obtained from the base of the skull through the vertex without intravenous contrast. RADIATION DOSE REDUCTION: This exam was performed according to the departmental dose-optimization program which includes automated exposure control, adjustment of the mA and/or kV according to patient size and/or use of iterative reconstruction technique. COMPARISON:  09/10/2022 FINDINGS: Brain: No evidence of acute infarction, hemorrhage, hydrocephalus, extra-axial collection or mass lesion/mass effect. Vascular: No hyperdense vessel or unexpected calcification.  Skull: Normal. Negative for fracture or focal lesion. Sinuses/Orbits: No acute finding. Other: None. IMPRESSION: No acute intracranial abnormality. Electronically Signed   By: Davina Poke D.O.   On: 09/25/2022 12:58    Positive ROS: All other systems have been reviewed and were otherwise negative with the exception of those mentioned in the HPI and as above.  Physical Exam: General: Alert, no acute distress Cardiovascular: No pedal edema Respiratory: No cyanosis, no use of accessory musculature GI: No organomegaly, abdomen is soft and non-tender Skin: No lesions in the area of chief complaint Neurologic: Sensation intact distally Psychiatric: Patient is competent for consent with normal mood and affect Lymphatic: No axillary or cervical lymphadenopathy  MUSCULOSKELETAL Left lower extremity: Staples are intact.  There is surrounding erythema, no drainage.  Patient is able to flex and extend the hip there is tenderness to palpation about the incision.   Assessment: Status post left hip I&D 2/11 with no culture growth, returning due to cellulitis around the incision, CT showing small fluid collection. Patient previously left AMA without antibiotics.   Plan: -Appreciate hospitalist team management, continue IV antibiotics -Discussed case with Dr. Harlow Mares. Recommend IR aspiration of fluid collection. Will continue to monitor for clinical improvement. There is no urgent need for repeat I&D at this time.    Renee Harder, MD    09/26/2022 7:55 AM

## 2022-09-26 NOTE — Significant Event (Signed)
Pt was reported to be off unit. Was observed/followed by [facility] employees. Pt was returned of his own free will to [facility]. Pt was past [address]. He had pulled out his IV. Returned to [facility]. [facility] staff did not lay hands on the patient to convince him to return. Was informed patient is leaving against medical advice.

## 2022-09-26 NOTE — Discharge Summary (Signed)
  Physician Discharge Summary   Patient: Eric Clarke MRN: [id number] DOB: May 05, 1991  Admit date:     09/25/2022  Discharge date: 09/26/22  Discharge Physician: Lorella Nimrod   PCP: Patient, No Pcp Per    Later informed by the nursing staff that patient left AMA. He left before I can talk with him. Please see today's progress note for the plan management.  Will be high risk for readmission based on his behavior leaving AMA's and continued to have septic arthritis and IV drug use.    Signed: Lorella Nimrod, MD [facility] 09/26/2022

## 2022-09-26 NOTE — Assessment & Plan Note (Signed)
UDS positive for amphetamines.  Patient admitted daily IV drug use. Counseling was provided. -Nicotine patch for tobacco abuse

## 2022-09-26 NOTE — Progress Notes (Signed)
This nurse was notified by charge nurse that Pt was ambulating in the hallway towards the 1 C dept and might be leaving the unit.Hospital security was alerted and this nurse and security officer redirected Pt back to his room.Pt was educated on procedure scheduled this noon and to manage his frustrations as nursing is waiting on IR to call and transport him for  the procedure.Pt said he just wanted to work around the hallway and he felt better.Lest than 5 minutes this nurse left Pt's room.This nurse heard over PAS that a Pt was walking out the front entrance and [place] were attempting to get him to come back.Pt was not in his room and this nurse ran to the front and followed other personnel to [address] were Pt was walking along the road.Animal nutritionist and this nurse informed Pt that he has right to leave the hospital but he has to comply with the hospital policy and he can leave at his own discretion.Pt agreed to come back to the unit and sign the AMA.Dr Reesa Chew was notified per protocol and Pt signed the AMA and was escorted by security officer to the front entrance.

## 2022-09-26 NOTE — Progress Notes (Signed)
Progress Note   Patient: Eric Clarke [id number] DOB: 09/15/1990 DOA: 09/25/2022     1 DOS: the patient was seen and examined on 09/26/2022   Brief hospital course: Taken from H&P.  GRACESON HEBDA is a 32 y.o. male with medical history significant of f IVDU, polysubstance abuse, tobacco abuse, who presents with left hip pain.   Patient was recently hospitalized due to septic left hip joint,  Dr. Harlow Mares did surgical drainage of his left hip on 2/11. Deep wound cultures with no organism seen.  Unfortunately patient left hospital AMA.  He comes back with worsening left heel pain.  Left hip is erythematous, with staples still in place from previous surgery.  His pain is severe, constant, nonradiating.  Patient denies fever and chills.  No nausea vomiting, diarrhea or abdominal pain.  Data reviewed independently and ED Course: pt was found to have WBC 9.0, GFR> 60, temperature normal, blood pressure 129/94, heart rate 80, RR 11, oxygen saturation 100% on room air.  CT-left hip showed fluid collection in anterior compartment of the proximal left thigh anterior to the hip joint as below.  CT head was negative for any acute abnormality.  Orthopedic was consulted and they were recommending aspiration of fluid collection by IR with labs which include cell count and culture.Marland Kitchen He was started on cefepime and vancomycin.  2/26: Vital stable.  UDS positive for amphetamine, apparently he was injecting daily.  Continue to have pain.  Awaiting aspiration of fluid by IR.  Assessment and Plan: * Septic arthritis of hip [facility])  Patient was recently hospitalized due to septic left hip joint. Dr. Harlow Mares did surgical drainage of his left hip on 2/11. Deep wound cultures with no organism seen.  Unfortunately patient left hospital AMA. Now pt comes back with worsening pain.  No fever or leukocytosis.  Clinically not septic.  CT with concern of small collection of fluid which can be an abscess versus  postoperative changes and collection.  Consulted Dr. Sharlet Salina of Ortho.  And their advice aspiration of concerning fluid by IR-still awaiting -Continue with broad-spectrum antibiotics -Continue with pain management  Polysubstance abuse ([place]) UDS positive for amphetamines.  Patient admitted daily IV drug use. Counseling was provided. -Nicotine patch for tobacco abuse  Tobacco abuse -Nicotine patch  Acute metabolic encephalopathy CT head negative.  Most likely secondary to drug abuse.  UDS positive for amphetamines.  Still little somnolent but easily arousable. -Continue to monitor    Subjective: Patient was sleeping comfortably when seen today.  Easily arousable and on waking up he was complaining of hip pain at surgical site.  Physical Exam: Vitals:   09/25/22 1541 09/25/22 1611 09/25/22 2353 09/26/22 0825  BP: 120/70 121/84 120/76 118/74  Pulse: 80 (!) 58 70 72  Resp: '16 15 18 16  '$ Temp:  (!) 97.3 F (36.3 C) 98 F (36.7 C) 98.6 F (37 C)  TempSrc:    Oral  SpO2: 98% 97% 97% 98%  Weight:      Height:       General.     In no acute distress. Pulmonary.  Lungs clear bilaterally, normal respiratory effort. CV.  Regular rate and rhythm, no JVD, rub or murmur. Abdomen.  Soft, nontender, nondistended, BS positive. CNS.  Alert and oriented .  No focal neurologic deficit. Extremities.  No edema, no cyanosis, pulses intact and symmetrical.  Mild erythema surrounding surgical site  Data Reviewed: Prior data reviewed  Family Communication: Discussed with patient  Disposition: Status  is: Inpatient Remains inpatient appropriate because: Severity of illness  Planned Discharge Destination: Home  Time spent: 45 minutes  This record has been created using Systems analyst. Errors have been sought and corrected,but may not always be located. Such creation errors do not reflect on the standard of care.   Author: Lorella Nimrod, MD 09/26/2022 2:23 PM  For on  call review [email or web address].

## 2022-09-26 NOTE — Assessment & Plan Note (Signed)
Patient was recently hospitalized due to septic left hip joint. Dr. Harlow Mares did surgical drainage of his left hip on 2/11. Deep wound cultures with no organism seen.  Unfortunately patient left hospital AMA. Now pt comes back with worsening pain.  No fever or leukocytosis.  Clinically not septic.  CT with concern of small collection of fluid which can be an abscess versus postoperative changes and collection.  Consulted Dr. Sharlet Salina of Ortho.  And their advice aspiration of concerning fluid by IR-still awaiting -Continue with broad-spectrum antibiotics -Continue with pain management

## 2022-09-26 NOTE — Hospital Course (Addendum)
Taken from H&P.  Eric Clarke is a 32 y.o. male with medical history significant of f IVDU, polysubstance abuse, tobacco abuse, who presents with left hip pain.   Patient was recently hospitalized due to septic left hip joint,  Dr. Harlow Mares did surgical drainage of his left hip on 2/11. Deep wound cultures with no organism seen.  Unfortunately patient left hospital AMA.  He comes back with worsening left heel pain.  Left hip is erythematous, with staples still in place from previous surgery.  His pain is severe, constant, nonradiating.  Patient denies fever and chills.  No nausea vomiting, diarrhea or abdominal pain.  Data reviewed independently and ED Course: pt was found to have WBC 9.0, GFR> 60, temperature normal, blood pressure 129/94, heart rate 80, RR 11, oxygen saturation 100% on room air.  CT-left hip showed fluid collection in anterior compartment of the proximal left thigh anterior to the hip joint as below.  CT head was negative for any acute abnormality.  Orthopedic was consulted and they were recommending aspiration of fluid collection by IR with labs which include cell count and culture.Marland Kitchen He was started on cefepime and vancomycin.  2/26: Vital stable.  UDS positive for amphetamine, apparently he was injecting daily.  Continue to have pain.  Awaiting aspiration of fluid by IR.

## 2022-09-26 NOTE — TOC Progression Note (Signed)
Transition of Care [facility]) - Progression Note    Patient Details  Name: Eric Clarke MRN: [id number] Date of Birth: 15-Nov-1990  [facility] CM/SW [place], RN Phone Number: 09/26/2022, 1:36 PM  Clinical Narrative:    Pt has surgery on L hip 2/10 to removed bone infection, left ama x2 days later. EMS endorsing infection is from hx IV drug use. Pt has not had any abx or treatment since he left 2/12.   No plan for surgery at this time,  TOC to follow and assist with DC planning and needs   Expected Discharge Plan: Home/Self Care Barriers to Discharge: Continued Medical Work up  Expected Discharge Plan and Services       Living arrangements for the past 2 months: Single Family Home                                       Social Determinants of Health (SDOH) Interventions SDOH Screenings   Food Insecurity: No Food Insecurity (09/25/2022)  Housing: Low Risk  (09/25/2022)  Transportation Needs: No Transportation Needs (09/25/2022)  Utilities: Not At Risk (09/25/2022)  Tobacco Use: High Risk (09/25/2022)    Readmission Risk Interventions     No data to display

## 2022-09-30 LAB — CULTURE, BLOOD (ROUTINE X 2)
Culture: NO GROWTH
Culture: NO GROWTH
Special Requests: ADEQUATE

## 2022-10-06 ENCOUNTER — Ambulatory Visit: Payer: Self-pay | Admitting: Infectious Diseases

## 2022-10-11 NOTE — Addendum Note (Signed)
Addended byBrenton Grills on: 10/11/2022 12:48 PM   Modules accepted: Orders

## 2023-09-15 ENCOUNTER — Other Ambulatory Visit: Payer: Self-pay

## 2023-09-15 DIAGNOSIS — R440 Auditory hallucinations: Secondary | ICD-10-CM | POA: Insufficient documentation

## 2023-09-15 DIAGNOSIS — Z5321 Procedure and treatment not carried out due to patient leaving prior to being seen by health care provider: Secondary | ICD-10-CM | POA: Insufficient documentation

## 2023-09-15 LAB — COMPREHENSIVE METABOLIC PANEL
ALT: 41 U/L (ref 0–44)
AST: 28 U/L (ref 15–41)
Albumin: 4.5 g/dL (ref 3.5–5.0)
Alkaline Phosphatase: 63 U/L (ref 38–126)
Anion gap: 11 (ref 5–15)
BUN: 16 mg/dL (ref 6–20)
CO2: 26 mmol/L (ref 22–32)
Calcium: 9.2 mg/dL (ref 8.9–10.3)
Chloride: 100 mmol/L (ref 98–111)
Creatinine, Ser: 0.67 mg/dL (ref 0.61–1.24)
GFR, Estimated: >60 mL/min (ref >60)
Glucose, Bld: 103 mg/dL — ABNORMAL HIGH (ref 70–99)
Potassium: 4 mmol/L (ref 3.5–5.1)
Sodium: 137 mmol/L (ref 135–145)
Total Bilirubin: 1.1 mg/dL (ref 0.0–1.2)
Total Protein: 7.4 g/dL (ref 6.5–8.1)

## 2023-09-15 LAB — ACETAMINOPHEN LEVEL: Acetaminophen (Tylenol), Serum: <10 ug/mL — ABNORMAL LOW (ref 10–30)

## 2023-09-15 LAB — CBC
HCT: 43.3 % (ref 39.0–52.0)
Hemoglobin: 14.7 g/dL (ref 13.0–17.0)
MCH: 31.3 pg (ref 26.0–34.0)
MCHC: 33.9 g/dL (ref 30.0–36.0)
MCV: 92.1 fL (ref 80.0–100.0)
Platelets: 426 10*3/uL — ABNORMAL HIGH (ref 150–400)
RBC: 4.7 MIL/uL (ref 4.22–5.81)
RDW: 12.5 % (ref 11.5–15.5)
WBC: 9.8 10*3/uL (ref 4.0–10.5)
nRBC: 0 % (ref 0.0–0.2)

## 2023-09-15 LAB — ETHANOL: Alcohol, Ethyl (B): <10 mg/dL (ref <10)

## 2023-09-15 LAB — SALICYLATE LEVEL: Salicylate Lvl: <7 mg/dL — ABNORMAL LOW (ref 7.0–30.0)

## 2023-09-15 NOTE — ED Triage Notes (Signed)
Patient C/O auditory hallucinations that began around 9 months ago. Patient is also seeking detox help. He states he uses meth-amphetamines and fentanyl. Last drug use was Wednesday. Patient states that the hallucinations are worse when he is under the influence. He denies that these voices tell him to hurt himself or others. Patient denies SI or HI.

## 2023-09-16 ENCOUNTER — Emergency Department
Admission: EM | Admit: 2023-09-16 | Discharge: 2023-09-16 | Discharge status: Against Medical Advice | Payer: MEDICAID | Attending: Emergency Medicine | Admitting: Emergency Medicine

## 2023-09-16 NOTE — ED Notes (Signed)
Pt called for repeat VS multiple times. No answer. Looked for pt in multiple areas of dept, no answer.

## 2023-11-18 ENCOUNTER — Encounter: Payer: Self-pay | Admitting: Emergency Medicine

## 2023-11-18 ENCOUNTER — Ambulatory Visit
Admission: EM | Admit: 2023-11-18 | Discharge: 2023-11-18 | Disposition: A | Discharge status: Home / Self Care | Attending: Emergency Medicine | Admitting: Emergency Medicine

## 2023-11-18 DIAGNOSIS — L02419 Cutaneous abscess of limb, unspecified: Secondary | ICD-10-CM | POA: Diagnosis not present

## 2023-11-18 MED ORDER — SULFAMETHOXAZOLE-TRIMETHOPRIM 800-160 MG PO TABS: 1.0000 | ORAL_TABLET | Freq: Two times a day (BID) | ORAL | 0 refills | Status: AC

## 2023-11-18 NOTE — ED Triage Notes (Signed)
 Pt has abscess on his left inner wrist. Started yesterday. He states he is 5 months clean from IV drug use so does not believe it is from that. He states something bite him yesterday while [place] a [place] in the woods. He has been taking tylenol  for the pain.

## 2023-11-18 NOTE — Discharge Instructions (Addendum)
 Take the Bactrim  twice daily with full glass of water for 7 days for treatment of your abscess.  Apply warm compresses to the abscess to see beget it to come to ahead and rupture on its own.  Refrain from squeezing the abscess to try and make it rupture on its own.  If you develop any increased redness, swelling, red streaks going up your arm, or fever you need to go to the ER for evaluation.

## 2023-11-18 NOTE — ED Provider Notes (Signed)
 [facility]    CSN: [id number] Arrival date & time: 11/18/23  1402      History   Chief Complaint Chief Complaint  Patient presents with   Abscess    HPI Eric Clarke is a 33 y.o. male.   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  He does have a history of IV drug use but reports that he has been clean for the last 5 to 6 months.  He denies ever licking his needles.  He reports that he noticed the area develop yesterday after he helped his father move a deer blind and he is concerned he may have been bitten by something.  He did not feel a bite or sting.  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 Authorizing Provider  sulfamethoxazole -trimethoprim  (BACTRIM  DS) 800-160 MG tablet Take 1 tablet by  mouth 2 (two) times daily for 7 days. 11/18/23 11/25/23 Yes Kent Pear, NP    Family History Family History  Problem Relation Age of Onset   Hypertension Father    Diabetes Father     Social History Social History   Tobacco Use   Smoking status: Every Day    Current packs/day: 0.50    Types: Cigarettes   Smokeless tobacco: Current    Types: Chew  Vaping Use   Vaping status: Every Day  Substance Use Topics   Alcohol use: Not Currently    Comment: occ   Drug use: Not Currently    Types: Methamphetamines, Marijuana, IV    Comment: herion, percocet, saboxone     Allergies   Doxycycline   Review of Systems Review of Systems  Constitutional:  Negative for fever.  Skin:  Positive for color change and wound.     Physical Exam Triage Vital Signs ED Triage Vitals  Encounter Vitals Group     BP 11/18/23 1437 (!) 154/111     Systolic BP Percentile --      Diastolic BP Percentile --      Pulse Rate 11/18/23 1437 (!) 104     Resp 11/18/23 1437 20     Temp 11/18/23 1437 98.4 F (36.9 C)     Temp Source 11/18/23 1437 Oral     SpO2 11/18/23 1437 99 %     Weight 11/18/23 1435 149 lb 14.6 oz (68 kg)     Height 11/18/23  1435 5\' 7"  (1.702 m)     Head Circumference --      Peak Flow --      Pain Score 11/18/23 1434 8     Pain Loc --      Pain Education --      Exclude from Growth Chart --    No data found.  Updated Vital Signs BP (!) 150/106 (BP Location: Right Arm)   Pulse (!) 104   Temp 98.4 F (36.9 C) (Oral)   Resp 20   Ht 5\' 7"  (1.702 m)   Wt 149 lb 14.6 oz (68 kg)   SpO2 99%   BMI 23.48 kg/m   Visual Acuity Right Eye Distance:   Left Eye Distance:   Bilateral Distance:    Right Eye Near:   Left Eye Near:    Bilateral Near:     Physical Exam Vitals and nursing note reviewed.  Constitutional:      Appearance: Normal appearance. He is not ill-appearing.  Skin:    General: Skin is warm and dry.     Capillary Refill: Capillary refill takes less  than 2 seconds.     Findings: Erythema and lesion present.  Neurological:     General: No focal deficit present.     Mental Status: He is alert and oriented to person, place, and time.      UC Treatments / Results  Labs (all labs ordered are listed, but only abnormal results are displayed) Labs Reviewed - No data to display  EKG   Radiology No results found.  Procedures Procedures (including critical care time)  Medications Ordered in UC Medications - No data to display  Initial Impression / Assessment and Plan / UC Course  I have reviewed the triage vital signs and the nursing notes.  Pertinent labs & imaging results that were available during my care of the patient were reviewed by me and considered in my medical decision making (see chart for details).   Patient is a pleasant, nontoxic-appearing 33 year old male presenting for evaluation of abscess on the inner aspect of his left wrist as outlined HPI above.  As you can see in image above, there is an erythematous raised area that is approximately the size of a half dollar on the lower aspect of the left wrist on the radial side.  The area is warm and tender to touch.  There is a scabbed area in the center the patient indicates that he has been squeezing on the area to try and get it to drain.  He states that he has never injected himself in his wrist before and reports that he has been sober for the past 5 months.  He does have track scars in both antecubital fossa.  Patient also states that he never lick his needles.  I will discharge patient home with a diagnosis of an immature abscess to his left wrist and start him on Bactrim  DS twice daily for 7 days because he has a anaphylaxis allergy to doxycycline.  I want to have good MRSA coverage.  I have advised the patient to apply warm compresses to the area to see if we get it to come to ahead and rupture on its own.  He may use over-the-counter Tylenol  and/or ibuprofen  as needed  for pain.  If he develops any increased swelling, redness, red streaks going up his arm, or fever recommend to go the ER for evaluation.   Final Clinical Impressions(s) / UC Diagnoses   Final  diagnoses:  Abscess of wrist     Discharge Instructions      Take the Bactrim  twice daily with full glass of water for 7 days for treatment of your abscess.  Apply warm compresses to the abscess to see beget it to come to ahead and rupture on its own.  Refrain from squeezing the abscess to try and make it rupture on its own.  If you develop any increased redness, swelling, red streaks going up your arm, or fever you need to go to the ER for evaluation.     ED Prescriptions     Medication Sig Dispense Auth. Provider   sulfamethoxazole -trimethoprim  (BACTRIM  DS) 800-160 MG tablet Take 1 tablet by mouth 2 (two) times daily for 7 days. 14 tablet Kent Pear, NP      PDMP not reviewed this encounter.   Kent Pear, NP 11/18/23 [phone]

## 2023-11-19 ENCOUNTER — Encounter: Payer: Self-pay | Admitting: Emergency Medicine

## 2023-11-19 ENCOUNTER — Other Ambulatory Visit: Payer: Self-pay

## 2023-11-19 ENCOUNTER — Inpatient Hospital Stay
Admission: EM | Admit: 2023-11-19 | Discharge: 2023-11-22 | DRG: 872 | Discharge status: Against Medical Advice | Attending: Hospitalist | Admitting: Hospitalist

## 2023-11-19 ENCOUNTER — Emergency Department

## 2023-11-19 DIAGNOSIS — Z833 Family history of diabetes mellitus: Secondary | ICD-10-CM

## 2023-11-19 DIAGNOSIS — Z881 Allergy status to other antibiotic agents status: Secondary | ICD-10-CM

## 2023-11-19 DIAGNOSIS — E876 Hypokalemia: Secondary | ICD-10-CM | POA: Diagnosis present

## 2023-11-19 DIAGNOSIS — L02414 Cutaneous abscess of left upper limb: Principal | ICD-10-CM

## 2023-11-19 DIAGNOSIS — Z87892 Personal history of anaphylaxis: Secondary | ICD-10-CM

## 2023-11-19 DIAGNOSIS — L0390 Cellulitis, unspecified: Secondary | ICD-10-CM | POA: Diagnosis present

## 2023-11-19 DIAGNOSIS — A4102 Sepsis due to Methicillin resistant Staphylococcus aureus: Principal | ICD-10-CM | POA: Diagnosis present

## 2023-11-19 DIAGNOSIS — Z720 Tobacco use: Secondary | ICD-10-CM | POA: Diagnosis present

## 2023-11-19 DIAGNOSIS — Z5329 Procedure and treatment not carried out because of patient's decision for other reasons: Secondary | ICD-10-CM | POA: Diagnosis present

## 2023-11-19 DIAGNOSIS — Z716 Tobacco abuse counseling: Secondary | ICD-10-CM

## 2023-11-19 DIAGNOSIS — F1910 Other psychoactive substance abuse, uncomplicated: Secondary | ICD-10-CM | POA: Diagnosis present

## 2023-11-19 DIAGNOSIS — F17210 Nicotine dependence, cigarettes, uncomplicated: Secondary | ICD-10-CM | POA: Diagnosis present

## 2023-11-19 DIAGNOSIS — L03114 Cellulitis of left upper limb: Secondary | ICD-10-CM

## 2023-11-19 DIAGNOSIS — R197 Diarrhea, unspecified: Secondary | ICD-10-CM | POA: Diagnosis present

## 2023-11-19 DIAGNOSIS — F17220 Nicotine dependence, chewing tobacco, uncomplicated: Secondary | ICD-10-CM | POA: Diagnosis present

## 2023-11-19 DIAGNOSIS — T63301A Toxic effect of unspecified spider venom, accidental (unintentional), initial encounter: Secondary | ICD-10-CM | POA: Diagnosis present

## 2023-11-19 DIAGNOSIS — F17290 Nicotine dependence, other tobacco product, uncomplicated: Secondary | ICD-10-CM | POA: Diagnosis present

## 2023-11-19 DIAGNOSIS — Z8249 Family history of ischemic heart disease and other diseases of the circulatory system: Secondary | ICD-10-CM

## 2023-11-19 DIAGNOSIS — Z8659 Personal history of other mental and behavioral disorders: Secondary | ICD-10-CM

## 2023-11-19 LAB — CBC WITH DIFFERENTIAL/PLATELET
Abs Immature Granulocytes: 0.05 10*3/uL (ref 0.00–0.07)
Basophils Absolute: 0.1 10*3/uL (ref 0.0–0.1)
Basophils Relative: 0 %
Eosinophils Absolute: 0.3 10*3/uL (ref 0.0–0.5)
Eosinophils Relative: 2 %
HCT: 43.6 % (ref 39.0–52.0)
Hemoglobin: 14.6 g/dL (ref 13.0–17.0)
Immature Granulocytes: 0 %
Lymphocytes Relative: 24 %
Lymphs Abs: 3.7 10*3/uL (ref 0.7–4.0)
MCH: 30.4 pg (ref 26.0–34.0)
MCHC: 33.5 g/dL (ref 30.0–36.0)
MCV: 90.8 fL (ref 80.0–100.0)
Monocytes Absolute: 1.1 10*3/uL — ABNORMAL HIGH (ref 0.1–1.0)
Monocytes Relative: 7 %
Neutro Abs: 10.6 10*3/uL — ABNORMAL HIGH (ref 1.7–7.7)
Neutrophils Relative %: 67 %
Platelets: 402 10*3/uL — ABNORMAL HIGH (ref 150–400)
RBC: 4.8 MIL/uL (ref 4.22–5.81)
RDW: 11.9 % (ref 11.5–15.5)
WBC: 15.8 10*3/uL — ABNORMAL HIGH (ref 4.0–10.5)
nRBC: 0 % (ref 0.0–0.2)

## 2023-11-19 LAB — COMPREHENSIVE METABOLIC PANEL WITH GFR
ALT: 28 U/L (ref 0–44)
AST: 20 U/L (ref 15–41)
Albumin: 4 g/dL (ref 3.5–5.0)
Alkaline Phosphatase: 62 U/L (ref 38–126)
Anion gap: 8 (ref 5–15)
BUN: 18 mg/dL (ref 6–20)
CO2: 25 mmol/L (ref 22–32)
Calcium: 8.8 mg/dL — ABNORMAL LOW (ref 8.9–10.3)
Chloride: 103 mmol/L (ref 98–111)
Creatinine, Ser: 0.99 mg/dL (ref 0.61–1.24)
GFR, Estimated: >60 mL/min (ref >60)
Glucose, Bld: 117 mg/dL — ABNORMAL HIGH (ref 70–99)
Potassium: 3.9 mmol/L (ref 3.5–5.1)
Sodium: 136 mmol/L (ref 135–145)
Total Bilirubin: 0.7 mg/dL (ref 0.0–1.2)
Total Protein: 7.5 g/dL (ref 6.5–8.1)

## 2023-11-19 LAB — LACTIC ACID, PLASMA: Lactic Acid, Venous: 1.3 mmol/L (ref 0.5–1.9)

## 2023-11-19 MED ORDER — HYDROMORPHONE HCL 1 MG/ML IJ SOLN
0.5000 mg | Freq: Once | INTRAMUSCULAR | Status: AC
  Administered 2023-11-19: 0.5 mg via INTRAVENOUS
  Filled 2023-11-19: qty 0.5

## 2023-11-19 MED ORDER — SODIUM CHLORIDE 0.9 % IV SOLN
2.0000 g | Freq: Three times a day (TID) | INTRAVENOUS | Status: DC
  Administered 2023-11-20 – 2023-11-21 (×4): 2 g via INTRAVENOUS
  Filled 2023-11-19 (×5): qty 12.5

## 2023-11-19 MED ORDER — SODIUM CHLORIDE 0.9 % IV SOLN
1.0000 g | Freq: Once | INTRAVENOUS | Status: AC
  Administered 2023-11-19: 1 g via INTRAVENOUS
  Filled 2023-11-19: qty 10

## 2023-11-19 MED ORDER — ONDANSETRON HCL 4 MG/2ML IJ SOLN: 4.0000 mg | Freq: Three times a day (TID) | INTRAMUSCULAR | Status: DC | PRN

## 2023-11-19 MED ORDER — HYDRALAZINE HCL 20 MG/ML IJ SOLN: 5.0000 mg | INTRAMUSCULAR | Status: DC | PRN

## 2023-11-19 MED ORDER — ACETAMINOPHEN 325 MG PO TABS: 650.0000 mg | ORAL_TABLET | Freq: Four times a day (QID) | ORAL | Status: DC | PRN

## 2023-11-19 MED ORDER — MORPHINE SULFATE (PF) 2 MG/ML IV SOLN
2.0000 mg | INTRAVENOUS | Status: DC | PRN
  Administered 2023-11-20 (×2): 2 mg via INTRAVENOUS
  Filled 2023-11-19 (×2): qty 1

## 2023-11-19 MED ORDER — VANCOMYCIN HCL IN DEXTROSE 1-5 GM/200ML-% IV SOLN
1000.0000 mg | Freq: Two times a day (BID) | INTRAVENOUS | Status: DC
  Administered 2023-11-20: 1000 mg via INTRAVENOUS
  Filled 2023-11-19 (×2): qty 200

## 2023-11-19 MED ORDER — SODIUM CHLORIDE 0.9 % IV BOLUS
2000.0000 mL | Freq: Once | INTRAVENOUS | Status: AC
  Administered 2023-11-19: 2000 mL via INTRAVENOUS

## 2023-11-19 MED ORDER — HEPARIN SODIUM (PORCINE) 5000 UNIT/ML IJ SOLN
5000.0000 [IU] | Freq: Three times a day (TID) | INTRAMUSCULAR | Status: DC
  Administered 2023-11-20 (×2): 5000 [IU] via SUBCUTANEOUS
  Filled 2023-11-19 (×2): qty 1

## 2023-11-19 MED ORDER — MORPHINE SULFATE (PF) 4 MG/ML IV SOLN
4.0000 mg | Freq: Once | INTRAVENOUS | Status: AC
  Administered 2023-11-19: 4 mg via INTRAVENOUS
  Filled 2023-11-19: qty 1

## 2023-11-19 MED ORDER — NICOTINE 21 MG/24HR TD PT24
21.0000 mg | MEDICATED_PATCH | Freq: Every day | TRANSDERMAL | Status: DC
  Administered 2023-11-20: 21 mg via TRANSDERMAL
  Filled 2023-11-19 (×4): qty 1

## 2023-11-19 MED ORDER — OXYCODONE-ACETAMINOPHEN 5-325 MG PO TABS
1.0000 | ORAL_TABLET | ORAL | Status: DC | PRN
  Administered 2023-11-20: 1 via ORAL
  Filled 2023-11-19: qty 1

## 2023-11-19 MED ORDER — VANCOMYCIN HCL IN DEXTROSE 1-5 GM/200ML-% IV SOLN
1000.0000 mg | Freq: Once | INTRAVENOUS | Status: AC
  Administered 2023-11-19: 1000 mg via INTRAVENOUS
  Filled 2023-11-19: qty 200

## 2023-11-19 MED ORDER — IOHEXOL 300 MG/ML  SOLN
75.0000 mL | Freq: Once | INTRAMUSCULAR | Status: AC | PRN
  Administered 2023-11-19: 75 mL via INTRAVENOUS

## 2023-11-19 MED ORDER — LIDOCAINE HCL (PF) 1 % IJ SOLN
10.0000 mL | Freq: Once | INTRAMUSCULAR | Status: AC
  Administered 2023-11-19: 10 mL
  Filled 2023-11-19: qty 10

## 2023-11-19 NOTE — ED Provider Notes (Signed)
 [facility] Provider Note    Event Date/Time   First MD Initiated Contact with Patient 11/19/23 1949     (approximate)   History   Chief Complaint Abscess and Wound Infection   HPI  Eric [facility] is a 33 y.o. male with past medical history of polysubstance abuse who presents to the ED complaining of wound infection.  Patient reports that he has had 3 days of increasing pain and swelling around the ventral portion of his left wrist.  He thinks he may have been bitten by a spider while helping his father move furniture outside.  He was initially evaluated at urgent care for the symptoms yesterday, started on Bactrim  at that time but has had worsening of the pain since then.  He has noticed redness tracking up through his left forearm and to the swelling in his left wrist has increased.  He describes subjective fevers and chills, but has not taken his temperature at home.  Patient mitts to history of drug use, but states he has been clean for the past 6 months.     Physical Exam   Triage Vital Signs: ED Triage Vitals  Encounter Vitals Group     BP 11/19/23 1950 (!) 156/108     Systolic BP Percentile --      Diastolic BP Percentile --      Pulse Rate 11/19/23 1950 88     Resp 11/19/23 1950 18     Temp 11/19/23 1950 97.9 F (36.6 C)     Temp Source 11/19/23 1950 Oral     SpO2 11/19/23 1950 100 %     Weight 11/19/23 1951 149 lb (67.6 kg)     Height 11/19/23 1951 5\' 7"  (1.702 m)     Head Circumference --      Peak Flow --      Pain Score 11/19/23 1950 9     Pain Loc --      Pain Education --      Exclude from Growth Chart --     Most recent vital signs: Vitals:   11/19/23 2113 11/19/23 2252  BP:  116/84  Pulse:  84  Resp:  20  Temp: 98.6 F (37 C)   SpO2:  100%    Constitutional: Alert and oriented. Eyes: Conjunctivae are normal. Head: Atraumatic. Nose: No congestion/rhinnorhea. Mouth/Throat: Mucous membranes are moist.   Cardiovascular: Normal rate, regular rhythm. Grossly normal heart sounds.  2+ radial pulses bilaterally. Respiratory: Normal respiratory effort.  No retractions. Lungs CTAB. Gastrointestinal: Soft and nontender. No distention. Musculoskeletal: Erythema, edema, warmth, and tenderness throughout entire left forearm circumferentially.  Wound with underlying edema and fluctuance noted over the ventral and radial portion of left wrist.  No lower extremity tenderness nor edema.  Neurologic:  Normal speech and language. No gross focal neurologic deficits are appreciated.     ED Results / Procedures / Treatments   Labs (all labs ordered are listed, but only abnormal results are displayed) Labs Reviewed  CBC WITH DIFFERENTIAL/PLATELET - Abnormal; Notable for the following components:      Result Value   WBC 15.8 (*)    Platelets 402 (*)    Neutro Abs 10.6 (*)    Monocytes Absolute 1.1 (*)    All other components within normal limits  COMPREHENSIVE METABOLIC PANEL WITH GFR - Abnormal; Notable for the following components:   Glucose, Bld 117 (*)    Calcium 8.8 (*)    All other components within  normal limits  CULTURE, BLOOD (ROUTINE X 2)  CULTURE, BLOOD (ROUTINE X 2)  AEROBIC CULTURE W GRAM STAIN (SUPERFICIAL SPECIMEN)  LACTIC ACID, PLASMA  APTT  C-REACTIVE PROTEIN  SEDIMENTATION RATE  HIV ANTIBODY (ROUTINE TESTING W REFLEX)  BASIC METABOLIC PANEL WITH GFR  CBC     EKG  ED ECG REPORT I, Twilla Galea, the attending physician, personally viewed and interpreted this ECG.   Date: 11/19/2023  EKG Time: 19:54  Rate: 73  Rhythm: normal sinus rhythm  Axis: Normal  Intervals:none  ST&T Change: None  RADIOLOGY CT of left forearm reviewed and interpreted by me with abscess over the ventral surface of the wrist.  PROCEDURES:  Critical Care performed: No  .Incision and Drainage  Date/Time: 11/19/2023 11:24 PM  Performed by: Twilla Galea, MD Authorized by: Twilla Galea,  MD   Consent:    Consent obtained:  Verbal   Consent given by:  Patient   Risks, benefits, and alternatives were discussed: yes     Risks discussed:  Bleeding, incomplete drainage, pain, infection and damage to other organs Universal protocol:    Patient identity confirmed:  Verbally with patient and arm band Location:    Type:  Abscess   Location:  Upper extremity   Upper extremity location:  Wrist   Wrist location:  L wrist Sedation:    Sedation type:  None Anesthesia:    Anesthesia method:  Local infiltration   Local anesthetic:  Lidocaine  1% w/o epi Procedure type:    Complexity:  Simple Procedure details:    Ultrasound guidance: no     Needle aspiration: no     Incision types:  Single straight   Wound management:  Irrigated with saline   Drainage:  Purulent and bloody   Drainage amount:  Moderate   Wound treatment:  Wound left open   Packing materials:  None Post-procedure details:    Procedure completion:  Tolerated well, no immediate complications    MEDICATIONS ORDERED IN ED: Medications  sodium chloride  0.9 % bolus 2,000 mL (has no administration in time range)  nicotine  (NICODERM CQ  - dosed in mg/24 hours) patch 21 mg (has no administration in time range)  morphine  (PF) 2 MG/ML injection 2 mg (has no administration in time range)  oxyCODONE -acetaminophen  (PERCOCET/ROXICET) 5-325 MG per tablet 1 tablet (has no administration in time range)  acetaminophen  (TYLENOL ) tablet 650 mg (has no administration in time range)  ondansetron  (ZOFRAN ) injection 4 mg (has no administration in time range)  heparin  injection 5,000 Units (has no administration in time range)  morphine  (PF) 4 MG/ML injection 4 mg (4 mg Intravenous Given 11/19/23 2048)  cefTRIAXone  (ROCEPHIN ) 1 g in sodium chloride  0.9 % 100 mL IVPB (0 g Intravenous Stopped 11/19/23 2200)  vancomycin  (VANCOCIN ) IVPB 1000 mg/200 mL premix (1,000 mg Intravenous New Bag/Given 11/19/23 2111)  iohexol  (OMNIPAQUE ) 300 MG/ML  solution 75 mL (75 mLs Intravenous Contrast Given 11/19/23 2132)  lidocaine  (PF) (XYLOCAINE ) 1 % injection 10 mL (10 mLs Other Given by Other 11/19/23 2245)  HYDROmorphone  (DILAUDID ) injection 0.5 mg (0.5 mg Intravenous Given 11/19/23 2256)     IMPRESSION / MDM / ASSESSMENT AND PLAN / ED COURSE  I reviewed the triage vital signs and the nursing notes.                              33 y.o. male with past medical history of polysubstance abuse who presents to the ED  complaining of worsening pain and swelling around left wrist extending into left forearm over the past 3 days.  Patient's presentation is most consistent with acute presentation with potential threat to life or bodily function.  Differential diagnosis includes, but is not limited to, abscess, cellulitis, sepsis, anemia, electrolyte abnormality, AKI.  Patient uncomfortable but nontoxic appearing and in no acute distress, vital signs are unremarkable.  He does have signs of abscess and cellulitis starting at the left wrist and extending throughout his forearm, appears to have progressed from photo yesterday at urgent care.  We will further assess with CT imaging as patient may benefit from incision and drainage of abscess.  Vital signs are reassuring and not concerning for sepsis at this time, but given history of drug use with worsening infection will draw blood cultures.  Plan to start IV antibiotics and treat symptomatically with IV morphine .  Labs with leukocytosis but no significant anemia, electrolyte abnormality, or AKI.  Lactic acid within normal limits and vital signs remain reassuring.  CT imaging does show abscess over the ventral surface of the left wrist with associated cellulitis.  Incision and drainage was performed and wound culture sent.  Case discussed with hospitalist for admission.      FINAL CLINICAL IMPRESSION(S) / ED DIAGNOSES   Final diagnoses:  Abscess of skin of left wrist  Cellulitis of left upper extremity      Rx / DC Orders   ED Discharge Orders     None        Note:  This document was prepared using Dragon voice recognition software and may include unintentional dictation errors.   Twilla Galea, MD 11/19/23 [phone]

## 2023-11-19 NOTE — H&P (Signed)
 History and Physical    Eric Clarke:[id number] DOB: 1990/09/26 DOA: 11/19/2023  Referring MD/NP/PA:   PCP: Patient, No Pcp Per   Patient coming from:  The patient is coming from home.     Chief Complaint: left forearm and wrist pain, diarrhea  HPI: Eric [facility] is a 33 y.o. male with medical history significant of  IVDU, former polysubstance abuse, tobacco abuse, left septic hip joint, who presents with left forearm and wrist pain, diarrhea  Patient states that he possibly had spider bite to his left forearm while helping his father move furniture outside 3 days ago, then developed pain, swelling and erythema in that area. The pain is sharp, severe, constant, nonradiating, progressively worsening.  Patient has chills, no fever.  Patient does not have chest pain, cough, SOB.  He does not have nausea, vomiting or abdominal pain.  He reports severe diarrhea, with more than 10 times of watery diarrhea today. He was initially evaluated at urgent care yesterday, started on Bactrim  at that time but has had worsening of the pain since then. The erythema has been spreading up through his left forearm to hand. Patient has hx of IV drug use, but states he has been clean for the past 6 months.  CT scan of left forearm showed a small abscess.  ED physician did I&D and send out sample for culture.  Data reviewed independently and ED Course: pt was found to have WBC 15.8, GFR> 60, INR 1.3, lactic acid 1.3.  Temperature normal, blood pressure 156/108, 126/88, heart rate 104, 88, RR 18, oxygen saturation 100% on room air.  Patient is placed in MedSurg bed for observation.  CT-left forearm: 2.0 cm phlegmon/developing abscess in the subcutaneous soft tissues of the radial aspect of the distal forearm with a tract extending to the skin surface. Adjacent cellulitis.     EKG: I have personally reviewed.  Sinus rhythm, QTc 422, poor R wave progression, borderline right axis  deviation.   Review of Systems:   General: no fevers, chills, no body weight gain, has fatigue HEENT: no blurry vision, hearing changes or sore throat Respiratory: no dyspnea, coughing, wheezing CV: no chest pain, no palpitations GI: no nausea, vomiting, abdominal pain, diarrhea, constipation GU: no dysuria, burning on urination, increased urinary frequency, hematuria  Ext: no leg edema.  Has swelling, erythema and pain in left forearm Neuro: no unilateral weakness, numbness, or tingling, no vision change or hearing loss Skin: no rash, no skin tear. MSK: No muscle spasm, no deformity, no limitation of range of movement in spin Heme: No easy bruising.  Travel history: No recent long distant travel.   Allergy:  Allergies  Allergen Reactions   Doxycycline Anaphylaxis    Past Medical History:  Diagnosis Date   Substance abuse Eric [facility])     Past Surgical History:  Procedure Laterality Date   FRACTURE SURGERY     INCISION AND DRAINAGE HIP Left 09/11/2022   Procedure: IRRIGATION AND DEBRIDEMENT HIP;  Surgeon: Jerlyn Moons, MD;  Location: [facility] ORS;  Service: Orthopedics;  Laterality: Left;    Social History:  reports that he has been smoking cigarettes. His smokeless tobacco use includes chew. He reports that he does not currently use alcohol. He reports that he does not currently use drugs after having used the following drugs: Methamphetamines, Marijuana, and IV.  Family History:  Family History  Problem Relation Age of Onset   Hypertension Father    Diabetes Father  Prior to Admission medications   Medication Sig Start Date End Date Taking? Authorizing Provider  sulfamethoxazole -trimethoprim  (BACTRIM  DS) 800-160 MG tablet Take 1 tablet by mouth 2 (two) times daily for 7 days. 11/18/23 11/25/23  Kent Pear, NP    Physical Exam: Vitals:   11/19/23 1951 11/19/23 2113 11/19/23 2252 11/19/23 2330  BP:   116/84 126/86  Pulse:   84 78  Resp:   20 14  Temp:  98.6 F (37  C)    TempSrc:  Oral    SpO2:   100% 99%  Weight: 67.6 kg     Height: 5\' 7"  (1.702 m)      General: Not in acute distress HEENT:       Eyes: PERRL, EOMI, no jaundice       ENT: No discharge from the ears and nose, no pharynx injection, no tonsillar enlargement.        Neck: No JVD, no bruit, no mass felt. Heme: No neck lymph node enlargement. Cardiac: S1/S2, RRR, No murmurs, No gallops or rubs. Respiratory: No rales, wheezing, rhonchi or rubs. GI: Soft, nondistended, nontender, no rebound pain, no organomegaly, BS present. GU: No hematuria Ext: No pitting leg edema bilaterally. 1+DP/PT pulse bilaterally.  Has tenderness, swelling, erythema, warmth in left forearm, spreading down to hand.     Musculoskeletal: No joint deformities, No joint redness or warmth, no limitation of ROM in spin. Skin: No rashes.  Neuro: Alert, oriented X3, cranial nerves II-XII grossly intact, moves all extremities normally.  Psych: Patient is not psychotic, no suicidal or hemocidal ideation.  Labs on Admission: I have personally reviewed following labs and imaging studies  CBC: Recent Labs  Lab 11/19/23 2047  WBC 15.8*  NEUTROABS 10.6*  HGB 14.6  HCT 43.6  MCV 90.8  PLT 402*   Basic Metabolic Panel: Recent Labs  Lab 11/19/23 2047  NA 136  K 3.9  CL 103  CO2 25  GLUCOSE 117*  BUN 18  CREATININE 0.99  CALCIUM 8.8*   GFR: Estimated Creatinine Clearance: 99.2 mL/min (by C-G formula based on SCr of 0.99 mg/dL). Liver Function Tests: Recent Labs  Lab 11/19/23 2047  AST 20  ALT 28  ALKPHOS 62  BILITOT 0.7  PROT 7.5  ALBUMIN 4.0   No results for input(s): "LIPASE", "AMYLASE" in the last 168 hours. No results for input(s): "AMMONIA" in the last 168 hours. Coagulation Profile: No results for input(s): "INR", "PROTIME" in the last 168 hours. Cardiac Enzymes: No results for input(s): "CKTOTAL", "CKMB", "CKMBINDEX", "TROPONINI" in the last 168 hours. BNP (last 3 results) No results  for input(s): "PROBNP" in the last 8760 hours. HbA1C: No results for input(s): "HGBA1C" in the last 72 hours. CBG: No results for input(s): "GLUCAP" in the last 168 hours. Lipid Profile: No results for input(s): "CHOL", "HDL", "LDLCALC", "TRIG", "CHOLHDL", "LDLDIRECT" in the last 72 hours. Thyroid Function Tests: No results for input(s): "TSH", "T4TOTAL", "FREET4", "T3FREE", "THYROIDAB" in the last 72 hours. Anemia Panel: No results for input(s): "VITAMINB12", "FOLATE", "FERRITIN", "TIBC", "IRON", "RETICCTPCT" in the last 72 hours. Urine analysis:    Component Value Date/Time   COLORURINE YELLOW (A) 05/30/2019 1734   APPEARANCEUR HAZY (A) 05/30/2019 1734   LABSPEC 1.024 05/30/2019 1734   PHURINE 5.0 05/30/2019 1734   GLUCOSEU NEGATIVE 05/30/2019 1734   HGBUR NEGATIVE 05/30/2019 1734   BILIRUBINUR NEGATIVE 05/30/2019 1734   KETONESUR NEGATIVE 05/30/2019 1734   PROTEINUR 30 (A) 05/30/2019 1734   NITRITE NEGATIVE 05/30/2019 1734  LEUKOCYTESUR NEGATIVE 05/30/2019 1734   Sepsis Labs: @LABRCNTIP (procalcitonin:4,lacticidven:4) )No results found for this or any previous visit (from the past 240 hours).   Radiological Exams on Admission:   Assessment/Plan Principal Problem:   Abscess of left forearm with celluilitis Active Problems:   hx of polysubstance abuse (HCC)   Tobacco abuse   Diarrhea   Assessment and Plan:  Abscess of left forearm with celluilitis: WBC 15.8, but no fever, lactic acid is normal at 1.3.  Clinically does not seem to have sepsis.  S/p of I&D by EDP.  - will place in med-surg bed for obs - Empiric antimicrobial treatment with vancomycin  and cefepime  (patient received 1 dose of Rocephin  in ED) - PRN Zofran  for nausea, and Tylenol , morphine  and Percocet for pain - Blood cultures x 2  - ESR and CRP - IVF: 2.0 L of NS bolus  hx of polysubstance abuse (HCC): Patient has hx of IV drug use, but states he has been clean for the past 6 months. - Encourage  patient do not use substances again - Check UDS  Tobacco abuse -Nicotine  patch - Did counseling about importance of quitting smoking  Diarrhea - IV fluid as above - Check C. difficile and GI pathogen panel       DVT ppx: SQ Heparin     Code Status: Full code    Family Communication:   Yes, patient's wife   at bed side.     Disposition Plan:  Anticipate discharge back to previous environment  Consults called:  none  Admission status and Level of care: Med-Surg:    for obs as inpt        Dispo: The patient is from: Home              Anticipated d/c is to: Home              Anticipated d/c date is: 1 day              Patient currently is not medically stable to d/c.    Severity of Illness:  The appropriate patient status for this patient is OBSERVATION. Observation status is judged to be reasonable and necessary in order to provide the required intensity of service to ensure the patient's safety. The patient's presenting symptoms, physical exam findings, and initial radiographic and laboratory data in the context of their medical condition is felt to place them at decreased risk for further clinical deterioration. Furthermore, it is anticipated that the patient will be medically stable for discharge from the [facility] within 2 midnights of admission.        Date of Service 11/20/2023    Fidencio Hue [facility]   If 7PM-7AM, please contact night-coverage [email or web address] 11/20/2023, 12:13 AM

## 2023-11-19 NOTE — ED Triage Notes (Signed)
  Patient BIB family for abscess and wound infection to L wrist.  Patient states he was bit by a spider 2 days ago and seen at [facility] and placed on bactrim .  Has taken two doses of the bactrim  but states the area is getting worse.  L wrist and forearm swollen and red.  Afebrile at this time.  Hx of IV drug use but states he has been clean for 6 months.  Pain 9/10, throbbing.

## 2023-11-19 NOTE — H&P (Incomplete)
 History and Physical    Eric Clarke [id number] DOB: September 19, 1990 DOA: 11/19/2023  Referring MD/NP/PA:   PCP: Patient, No Pcp Per   Patient coming from:  The patient is coming from home.     Chief Complaint: left forearm and wrist pain, diarrhea  HPI: Eric Clarke is a 33 y.o. male with medical history significant of  IVDU, former polysubstance abuse, tobacco abuse, left septic hip joint, who presents with left forearm and wrist pain, diarrhea  Patient states that he possibly had spider bite to his left forearm while helping his father move furniture outside 3 days ago, then developed pain, swelling and erythema in that area. The pain is sharp, severe, constant, nonradiating, progressively worsening.  Patient has chills, no fever.  Patient does not have chest pain, cough, SOB.  He does not have nausea, vomiting or abdominal pain.  He reports severe diarrhea, with more than 10 times of watery diarrhea today. He was initially evaluated at urgent care yesterday, started on Bactrim  at that time but has had worsening of the pain since then. The erythema has been spreading up through his left forearm to hand. Patient has hx of IV drug use, but states he has been clean for the past 6 months.  Data reviewed independently and ED Course: pt was found to have WBC 15.8, GFR> 60, INR 1.3,   EKG: I have personally reviewed.  Not done in ED, will get one.   ***   Review of Systems:   General: no fevers, chills, no body weight gain, has poor appetite, has fatigue HEENT: no blurry vision, hearing changes or sore throat Respiratory: no dyspnea, coughing, wheezing CV: no chest pain, no palpitations GI: no nausea, vomiting, abdominal pain, diarrhea, constipation GU: no dysuria, burning on urination, increased urinary frequency, hematuria  Ext: no leg edema Neuro: no unilateral weakness, numbness, or tingling, no vision change or hearing loss Skin: no rash, no skin tear. MSK: No muscle  spasm, no deformity, no limitation of range of movement in spin Heme: No easy bruising.  Travel history: No recent long distant travel.   Allergy:  Allergies  Allergen Reactions  . Doxycycline Anaphylaxis    Past Medical History:  Diagnosis Date  . Substance abuse [facility])     Past Surgical History:  Procedure Laterality Date  . FRACTURE SURGERY    . INCISION AND DRAINAGE HIP Left 09/11/2022   Procedure: IRRIGATION AND DEBRIDEMENT HIP;  Surgeon: Jerlyn Moons, MD;  Location: [facility] ORS;  Service: Orthopedics;  Laterality: Left;    Social History:  reports that he has been smoking cigarettes. His smokeless tobacco use includes chew. He reports that he does not currently use alcohol. He reports that he does not currently use drugs after having used the following drugs: Methamphetamines, Marijuana, and IV.  Family History:  Family History  Problem Relation Age of Onset  . Hypertension Father   . Diabetes Father      Prior to Admission medications   Medication Sig Start Date End Date Taking? Authorizing Provider  sulfamethoxazole -trimethoprim  (BACTRIM  DS) 800-160 MG tablet Take 1 tablet by mouth 2 (two) times daily for 7 days. 11/18/23 11/25/23  Kent Pear, NP    Physical Exam: Vitals:   11/19/23 1951 11/19/23 2113 11/19/23 2252 11/19/23 2330  BP:   116/84 126/86  Pulse:   84 78  Resp:   20 14  Temp:  98.6 F (37 C)    TempSrc:  Oral    SpO2:  100% 99%  Weight: 67.6 kg     Height: 5\' 7"  (1.702 m)      General: Not in acute distress HEENT:       Eyes: PERRL, EOMI, no jaundice       ENT: No discharge from the ears and nose, no pharynx injection, no tonsillar enlargement.        Neck: No JVD, no bruit, no mass felt. Heme: No neck lymph node enlargement. Cardiac: S1/S2, RRR, No murmurs, No gallops or rubs. Respiratory: No rales, wheezing, rhonchi or rubs. GI: Soft, nondistended, nontender, no rebound pain, no organomegaly, BS present. GU: No hematuria Ext: No pitting  leg edema bilaterally. 1+DP/PT pulse bilaterally. Musculoskeletal: No joint deformities, No joint redness or warmth, no limitation of ROM in spin. Skin: No rashes.  Neuro: Alert, oriented X3, cranial nerves II-XII grossly intact, moves all extremities normally. Muscle strength 5/5 in all extremities, sensation to light touch intact. Brachial reflex 2+ bilaterally. Knee reflex 1+ bilaterally. Negative Babinski's sign. Normal finger to nose test. Psych: Patient is not psychotic, no suicidal or hemocidal ideation.  Labs on Admission: I have personally reviewed following labs and imaging studies  CBC: Recent Labs  Lab 11/19/23 2047  WBC 15.8*  NEUTROABS 10.6*  HGB 14.6  HCT 43.6  MCV 90.8  PLT 402*   Basic Metabolic Panel: Recent Labs  Lab 11/19/23 2047  NA 136  K 3.9  CL 103  CO2 25  GLUCOSE 117*  BUN 18  CREATININE 0.99  CALCIUM 8.8*   GFR: Estimated Creatinine Clearance: 99.2 mL/min (by C-G formula based on SCr of 0.99 mg/dL). Liver Function Tests: Recent Labs  Lab 11/19/23 2047  AST 20  ALT 28  ALKPHOS 62  BILITOT 0.7  PROT 7.5  ALBUMIN 4.0   No results for input(s): "LIPASE", "AMYLASE" in the last 168 hours. No results for input(s): "AMMONIA" in the last 168 hours. Coagulation Profile: No results for input(s): "INR", "PROTIME" in the last 168 hours. Cardiac Enzymes: No results for input(s): "CKTOTAL", "CKMB", "CKMBINDEX", "TROPONINI" in the last 168 hours. BNP (last 3 results) No results for input(s): "PROBNP" in the last 8760 hours. HbA1C: No results for input(s): "HGBA1C" in the last 72 hours. CBG: No results for input(s): "GLUCAP" in the last 168 hours. Lipid Profile: No results for input(s): "CHOL", "HDL", "LDLCALC", "TRIG", "CHOLHDL", "LDLDIRECT" in the last 72 hours. Thyroid Function Tests: No results for input(s): "TSH", "T4TOTAL", "FREET4", "T3FREE", "THYROIDAB" in the last 72 hours. Anemia Panel: No results for input(s): "VITAMINB12", "FOLATE",  "FERRITIN", "TIBC", "IRON", "RETICCTPCT" in the last 72 hours. Urine analysis:    Component Value Date/Time   COLORURINE YELLOW (A) 05/30/2019 1734   APPEARANCEUR HAZY (A) 05/30/2019 1734   LABSPEC 1.024 05/30/2019 1734   PHURINE 5.0 05/30/2019 1734   GLUCOSEU NEGATIVE 05/30/2019 1734   HGBUR NEGATIVE 05/30/2019 1734   BILIRUBINUR NEGATIVE 05/30/2019 1734   KETONESUR NEGATIVE 05/30/2019 1734   PROTEINUR 30 (A) 05/30/2019 1734   NITRITE NEGATIVE 05/30/2019 1734   LEUKOCYTESUR NEGATIVE 05/30/2019 1734   Sepsis Labs: @LABRCNTIP (procalcitonin:4,lacticidven:4) )No results found for this or any previous visit (from the past 240 hours).   Radiological Exams on Admission:   Assessment/Plan Principal Problem:   Abscess of left forearm with celluilitis Active Problems:   hx of polysubstance abuse (HCC)   Tobacco abuse   Assessment and Plan: No notes have been filed under this hospital service. Service: Hospitalist      Principal Problem:   Abscess of left  forearm with celluilitis Active Problems:   hx of polysubstance abuse (HCC)   Tobacco abuse    DVT ppx: SQ Heparin          SQ Lovenox   Code Status: Full code   ***  Family Communication:     not done, no family member is at bed side.              Yes, patient's    at bed side.       by phone   ***  Disposition Plan:  Anticipate discharge back to previous environment  Consults called:    Admission status and Level of care: Med-Surg:    for obs as inpt        Dispo: The patient is from: {From:23814}              Anticipated d/c is to: {To:23815}              Anticipated d/c date is: {Days:23816}              Patient currently {Medically stable:23817}    Severity of Illness:  {Observation/Inpatient:21159}       Date of Service 11/19/2023    Fidencio Hue [facility]   If 7PM-7AM, please contact night-coverage [email or web address] 11/19/2023, 11:55 PM

## 2023-11-19 NOTE — Progress Notes (Signed)
 Pharmacy Antibiotic Note  Eric Clarke is a 33 y.o. male admitted on 11/19/2023 with left forearm abscess.  Pharmacy has been consulted for Vancomycin  dosing.  Plan: Pt given Vancomycin  1000 mg once. Vancomycin  1000 mg IV Q 12 hrs. Goal AUC 400-550. Expected AUC: 477.9 SCr used: 0.99  Pharmacy will continue to follow and will adjust abx dosing whenever warranted.  Temp (24hrs), Avg:98.3 F (36.8 C), Min:97.9 F (36.6 C), Max:98.6 F (37 C)   Recent Labs  Lab 11/19/23 2047  WBC 15.8*  CREATININE 0.99  LATICACIDVEN 1.3    Estimated Creatinine Clearance: 99.2 mL/min (by C-G formula based on SCr of 0.99 mg/dL).    Allergies  Allergen Reactions   Doxycycline Anaphylaxis    Antimicrobials this admission: 4/20 Ceftriaxone  >> x 1 dose 4/20 Vancomycin  >>  4/21 Cefepime  >>   Microbiology results: 4/20 BCx: Pending  Thank you for allowing pharmacy to be a part of this patient's care.  Coretta Dexter, PharmD, [facility] 11/19/2023 11:36 PM

## 2023-11-20 DIAGNOSIS — F17290 Nicotine dependence, other tobacco product, uncomplicated: Secondary | ICD-10-CM | POA: Diagnosis present

## 2023-11-20 DIAGNOSIS — E876 Hypokalemia: Secondary | ICD-10-CM | POA: Diagnosis present

## 2023-11-20 DIAGNOSIS — L03114 Cellulitis of left upper limb: Secondary | ICD-10-CM | POA: Diagnosis present

## 2023-11-20 DIAGNOSIS — T63301A Toxic effect of unspecified spider venom, accidental (unintentional), initial encounter: Secondary | ICD-10-CM | POA: Diagnosis present

## 2023-11-20 DIAGNOSIS — L0390 Cellulitis, unspecified: Secondary | ICD-10-CM | POA: Diagnosis present

## 2023-11-20 DIAGNOSIS — F17210 Nicotine dependence, cigarettes, uncomplicated: Secondary | ICD-10-CM | POA: Diagnosis present

## 2023-11-20 DIAGNOSIS — Z8249 Family history of ischemic heart disease and other diseases of the circulatory system: Secondary | ICD-10-CM | POA: Diagnosis not present

## 2023-11-20 DIAGNOSIS — R197 Diarrhea, unspecified: Secondary | ICD-10-CM | POA: Diagnosis present

## 2023-11-20 DIAGNOSIS — Z5329 Procedure and treatment not carried out because of patient's decision for other reasons: Secondary | ICD-10-CM | POA: Diagnosis present

## 2023-11-20 DIAGNOSIS — Z833 Family history of diabetes mellitus: Secondary | ICD-10-CM | POA: Diagnosis not present

## 2023-11-20 DIAGNOSIS — Z716 Tobacco abuse counseling: Secondary | ICD-10-CM | POA: Diagnosis not present

## 2023-11-20 DIAGNOSIS — L02414 Cutaneous abscess of left upper limb: Secondary | ICD-10-CM | POA: Diagnosis present

## 2023-11-20 DIAGNOSIS — Z881 Allergy status to other antibiotic agents status: Secondary | ICD-10-CM | POA: Diagnosis not present

## 2023-11-20 DIAGNOSIS — Z87892 Personal history of anaphylaxis: Secondary | ICD-10-CM | POA: Diagnosis not present

## 2023-11-20 DIAGNOSIS — A4102 Sepsis due to Methicillin resistant Staphylococcus aureus: Secondary | ICD-10-CM | POA: Diagnosis present

## 2023-11-20 DIAGNOSIS — Z8659 Personal history of other mental and behavioral disorders: Secondary | ICD-10-CM | POA: Diagnosis not present

## 2023-11-20 DIAGNOSIS — F17220 Nicotine dependence, chewing tobacco, uncomplicated: Secondary | ICD-10-CM | POA: Diagnosis present

## 2023-11-20 LAB — BASIC METABOLIC PANEL WITH GFR
Anion gap: 6 (ref 5–15)
BUN: 16 mg/dL (ref 6–20)
CO2: 24 mmol/L (ref 22–32)
Calcium: 7.4 mg/dL — ABNORMAL LOW (ref 8.9–10.3)
Chloride: 105 mmol/L (ref 98–111)
Creatinine, Ser: 0.7 mg/dL (ref 0.61–1.24)
GFR, Estimated: >60 mL/min (ref >60)
Glucose, Bld: 116 mg/dL — ABNORMAL HIGH (ref 70–99)
Potassium: 3.4 mmol/L — ABNORMAL LOW (ref 3.5–5.1)
Sodium: 135 mmol/L (ref 135–145)

## 2023-11-20 LAB — URINE DRUG SCREEN, QUALITATIVE (ARMC ONLY)
Amphetamines, Ur Screen: POSITIVE — AB
Barbiturates, Ur Screen: NOT DETECTED
Benzodiazepine, Ur Scrn: NOT DETECTED
Cannabinoid 50 Ng, Ur ~~LOC~~: NOT DETECTED
Cocaine Metabolite,Ur ~~LOC~~: NOT DETECTED
MDMA (Ecstasy)Ur Screen: NOT DETECTED
Methadone Scn, Ur: NOT DETECTED
Opiate, Ur Screen: POSITIVE — AB
Phencyclidine (PCP) Ur S: NOT DETECTED
Tricyclic, Ur Screen: NOT DETECTED

## 2023-11-20 LAB — CBC
HCT: 36.9 % — ABNORMAL LOW (ref 39.0–52.0)
Hemoglobin: 12.5 g/dL — ABNORMAL LOW (ref 13.0–17.0)
MCH: 30.1 pg (ref 26.0–34.0)
MCHC: 33.9 g/dL (ref 30.0–36.0)
MCV: 88.9 fL (ref 80.0–100.0)
Platelets: 368 10*3/uL (ref 150–400)
RBC: 4.15 MIL/uL — ABNORMAL LOW (ref 4.22–5.81)
RDW: 12 % (ref 11.5–15.5)
WBC: 14.8 10*3/uL — ABNORMAL HIGH (ref 4.0–10.5)
nRBC: 0 % (ref 0.0–0.2)

## 2023-11-20 LAB — MRSA NEXT GEN BY PCR, NASAL: MRSA by PCR Next Gen: DETECTED — AB

## 2023-11-20 LAB — HIV ANTIBODY (ROUTINE TESTING W REFLEX): HIV Screen 4th Generation wRfx: NONREACTIVE

## 2023-11-20 LAB — APTT: aPTT: 31 s (ref 24–36)

## 2023-11-20 LAB — SEDIMENTATION RATE: Sed Rate: 59 mm/h — ABNORMAL HIGH (ref 0–15)

## 2023-11-20 LAB — C-REACTIVE PROTEIN: CRP: 5.4 mg/dL — ABNORMAL HIGH (ref <1.0)

## 2023-11-20 MED ORDER — CHLORHEXIDINE GLUCONATE CLOTH 2 % EX PADS
6.0000 | MEDICATED_PAD | Freq: Every day | CUTANEOUS | Status: DC
  Administered 2023-11-20 – 2023-11-22 (×3): 6 via TOPICAL

## 2023-11-20 MED ORDER — OXYCODONE-ACETAMINOPHEN 5-325 MG PO TABS: 1.0000 | ORAL_TABLET | ORAL | Status: DC | PRN

## 2023-11-20 MED ORDER — MUPIROCIN 2 % EX OINT
1.0000 | TOPICAL_OINTMENT | Freq: Two times a day (BID) | CUTANEOUS | Status: DC
  Administered 2023-11-20 – 2023-11-22 (×5): 1 via NASAL
  Filled 2023-11-20: qty 22

## 2023-11-20 MED ORDER — VANCOMYCIN HCL 500 MG/100ML IV SOLN
500.0000 mg | Freq: Two times a day (BID) | INTRAVENOUS | Status: DC
  Administered 2023-11-20 – 2023-11-22 (×4): 500 mg via INTRAVENOUS
  Filled 2023-11-20 (×6): qty 100

## 2023-11-20 MED ORDER — KETOROLAC TROMETHAMINE 15 MG/ML IJ SOLN
15.0000 mg | Freq: Once | INTRAMUSCULAR | Status: AC
  Administered 2023-11-20: 15 mg via INTRAVENOUS
  Filled 2023-11-20: qty 1

## 2023-11-20 MED ORDER — OXYCODONE-ACETAMINOPHEN 5-325 MG PO TABS
1.0000 | ORAL_TABLET | ORAL | Status: DC | PRN
  Administered 2023-11-20 – 2023-11-21 (×3): 2 via ORAL
  Administered 2023-11-21: 1 via ORAL
  Administered 2023-11-21 – 2023-11-22 (×4): 2 via ORAL
  Filled 2023-11-20 (×2): qty 2
  Filled 2023-11-20: qty 1
  Filled 2023-11-20 (×5): qty 2
  Filled 2023-11-20 (×2): qty 1

## 2023-11-20 MED ORDER — OXYCODONE HCL 5 MG PO TABS
5.0000 mg | ORAL_TABLET | Freq: Once | ORAL | Status: AC
  Administered 2023-11-20: 5 mg via ORAL
  Filled 2023-11-20: qty 1

## 2023-11-20 MED ORDER — VANCOMYCIN HCL 1250 MG/250ML IV SOLN
1250.0000 mg | Freq: Two times a day (BID) | INTRAVENOUS | Status: DC
  Filled 2023-11-20 (×2): qty 250

## 2023-11-20 MED ORDER — ENOXAPARIN SODIUM 40 MG/0.4ML IJ SOSY
40.0000 mg | PREFILLED_SYRINGE | INTRAMUSCULAR | Status: DC
  Administered 2023-11-20 – 2023-11-21 (×2): 40 mg via SUBCUTANEOUS
  Filled 2023-11-20 (×2): qty 0.4

## 2023-11-20 MED ORDER — VANCOMYCIN HCL 750 MG/150ML IV SOLN
750.0000 mg | Freq: Two times a day (BID) | INTRAVENOUS | Status: DC
  Administered 2023-11-20 – 2023-11-22 (×4): 750 mg via INTRAVENOUS
  Filled 2023-11-20 (×5): qty 150

## 2023-11-20 NOTE — Progress Notes (Signed)
 Pharmacy Antibiotic Note  Eric Clarke is a 33 y.o. male admitted on 11/19/2023 with left forearm abscess.  Pharmacy has been consulted for Vancomycin  dosing.  -Hx of IV drug use -I&D wound cx 4/20:  [facility]  Plan: Scr improved 0.99 >> 0.7 Will adjust vancomycin  to 1250 mg IV q12h Goal AUC 400-550. Expected AUC: 483 Cmin 11.7 SCr used: 0.80  (actual 0.70)  Continue on cefepime  2 gm IV q8h  Pharmacy will continue to follow and will adjust abx dosing as warranted.  Temp (24hrs), Avg:98.1 F (36.7 C), Min:97.9 F (36.6 C), Max:98.6 F (37 C)   Recent Labs  Lab 11/19/23 2047 11/20/23 0123  WBC 15.8* 14.8*  CREATININE 0.99 0.70  LATICACIDVEN 1.3  --     Estimated Creatinine Clearance: 122.8 mL/min (by C-G formula based on SCr of 0.7 mg/dL).    Allergies  Allergen Reactions   Doxycycline Anaphylaxis    Antimicrobials this admission: 4/20 Ceftriaxone  >> x 1 dose 4/20 Vancomycin  >>  4/21 Cefepime  >>   Microbiology results: 4/20 BCx: NGTD 4/20 wound cx:  GPC  Thank you for allowing pharmacy to be a part of this patient's care.  Thomasine Flick PharmD Clinical Pharmacist 11/20/2023

## 2023-11-20 NOTE — Plan of Care (Signed)
   Problem: Coping: Goal: Level of anxiety will decrease Outcome: Progressing   Problem: Pain Managment: Goal: General experience of comfort will improve and/or be controlled Outcome: Progressing   Problem: Safety: Goal: Ability to remain free from injury will improve Outcome: Progressing

## 2023-11-20 NOTE — Plan of Care (Signed)
  Problem: Education: Goal: Knowledge of General Education information will improve Description: Including pain rating scale, medication(s)/side effects and non-pharmacologic comfort measures Outcome: Progressing   Problem: Health Behavior/Discharge Planning: Goal: Ability to manage health-related needs will improve Outcome: Progressing   Problem: Clinical Measurements: Goal: Ability to maintain clinical measurements within normal limits will improve Outcome: Progressing Goal: Will remain free from infection Outcome: Not Progressing Goal: Diagnostic test results will improve Outcome: Not Progressing Goal: Respiratory complications will improve Outcome: Progressing Goal: Cardiovascular complication will be avoided Outcome: Progressing   Problem: Activity: Goal: Risk for activity intolerance will decrease Outcome: Progressing   Problem: Nutrition: Goal: Adequate nutrition will be maintained Outcome: Progressing   Problem: Coping: Goal: Level of anxiety will decrease Outcome: Progressing   Problem: Elimination: Goal: Will not experience complications related to bowel motility Outcome: Progressing Goal: Will not experience complications related to urinary retention Outcome: Progressing

## 2023-11-20 NOTE — *Deleted (Incomplete)
 Hypoglycemic Event  CBG: ***  Treatment: {Hypoglycemic Treatment (must also place order and document administration):3049002}  Symptoms: {Hypoglycemic Symptoms:3049003}  Follow-up CBG: Time:*** CBG Result:***  Possible Reasons for Event: {Possible Reasons for ZOXWR:6045409}  Comments/MD notified:***    [name]

## 2023-11-20 NOTE — Progress Notes (Signed)
  PROGRESS NOTE    Eric Clarke  ZOX:[id number] DOB: 1991/06/30 DOA: 11/19/2023 PCP: Patient, No Pcp Per  138A/138A-AA  LOS: 0 days   Brief hospital course:   Assessment & Plan: Eric Clarke is a 33 y.o. male with medical history significant of  IVDU, former polysubstance abuse, tobacco abuse, left septic hip joint, who presents with left forearm and wrist pain, diarrhea   Patient states that he possibly had spider bite to his left forearm while helping his father move furniture outside 3 days ago, then developed pain, swelling and erythema in that area.   Abscess of left forearm with celluilitis:  S/p of I&D by EDP, and started on vancomycin  and cefepime  (patient received 1 dose of Rocephin  in ED) --more purulence and induration today --ortho consult --cont vanc and cefepime  --NPO MN for possible surgical I/D tomorrow --Percocet PRN for pain   Sepsis --tachycardia ane leukocytosis, source cellulitis  hx of polysubstance abuse [facility]):  Patient has hx of IV drug use, but states he has been clean for the past 6 months. - UDS pos for amphetamine and opiate   Tobacco abuse - Did counseling about importance of quitting smoking by admitter --cont nicotine  patch   Diarrhea --no diarrhea since presentation  Hypokalemia --monitor and supplement PRN   DVT prophylaxis: Lovenox  SQ Code Status: Full code  Family Communication:  Level of care: Med-Surg Dispo:   The patient is from: home Anticipated d/c is to: home Anticipated d/c date is: 2-3 days   Subjective and Interval History:  Pt reported more swelling in left forearm and hand.  Significant pain.   Objective: Vitals:   11/20/23 0019 11/20/23 0347 11/20/23 0832 11/20/23 1612  BP: (!) 133/93 122/72 132/89 121/75  Pulse: 75 75 71 71  Resp: 17 16 16 16   Temp: 98 F (36.7 C) 98 F (36.7 C) 98.1 F (36.7 C) 98.1 F (36.7 C)  TempSrc:      SpO2: 100% 96% 100% 100%  Weight:      Height:         Intake/Output Summary (Last 24 hours) at 11/20/2023 1824 Last data filed at 11/20/2023 1800 Gross per 24 hour  Intake 3129.61 ml  Output --  Net 3129.61 ml   Filed Weights   11/19/23 1951  Weight: 67.6 kg    Examination:   Constitutional: NAD, AAOx3 HEENT: conjunctivae and lids normal, EOMI CV: No cyanosis.   RESP: normal respiratory effort, on RA Extremities: left forearm and hand swollen, wound opening with purulence and induration. SKIN: warm, dry Neuro: II - XII grossly intact.   Psych: Normal mood and affect.  Appropriate judgement and reason     Data Reviewed: I have personally reviewed labs and imaging studies  Time spent: 50 minutes  Garrison Kanner, MD [facility] If 7PM-7AM, please contact night-coverage 11/20/2023, 6:24 PM

## 2023-11-20 NOTE — Consult Note (Signed)
 ORTHOPAEDIC CONSULTATION  REQUESTING PHYSICIAN: Garrison Kanner, MD  Chief Complaint:   Left forearm/wrist abscess  History of Present Illness: Eric Clarke is a 33 y.o. male with past medical history of polysubstance abuse scented to the emergency room last night with a 3-day history of increasing pain and swelling around the volar aspect of his left wrist and forearm with some expanding erythema.  Thinks he may have been bitten by a spider while helping move some furniture.  He was started on oral antibiotics after being seen in urgent care but came to the ER after worsened.  A 2 cm abscess was found on CT scan in the emergency room and the patient underwent a bedside irrigation debridement by the ER provider.  Orthopedics consulted today after the patient was admitted for IV antibiotics and showed recollection of fluid with no active drainage from the abscess.  Concern for persistent infection.  Patient reports the swelling and pain is improved from yesterday and there is less erythema and has not had any systemic symptoms overnight.  Denies any fevers or chills at this time.  Reports he is right-hand dominant  Past Medical History:  Diagnosis Date   Substance abuse [facility])    Past Surgical History:  Procedure Laterality Date   FRACTURE SURGERY     INCISION AND DRAINAGE HIP Left 09/11/2022   Procedure: IRRIGATION AND DEBRIDEMENT HIP;  Surgeon: Jerlyn Moons, MD;  Location: [facility] ORS;  Service: Orthopedics;  Laterality: Left;   Social History   Socioeconomic History   Marital status: Married    Spouse name: Not on file   Number of children: Not on file   Years of education: Not on file   Highest education level: Not on file  Occupational History   Not on file  Tobacco Use   Smoking status: Every Day    Current packs/day: 0.50    Types: Cigarettes   Smokeless tobacco: Current    Types: Chew  Vaping Use   Vaping  status: Every Day  Substance and Sexual Activity   Alcohol use: Not Currently    Comment: occ   Drug use: Not Currently    Types: Methamphetamines, Marijuana, IV    Comment: herion, percocet, saboxone   Sexual activity: Yes  Other Topics Concern   Not on file  Social History Narrative   Not on file   Social Drivers of Health   Financial Resource Strain: Low Risk  (10/31/2022)   Received from [facility]   Overall Financial Resource Strain (CARDIA)    Difficulty of Paying Living Expenses: Not very hard  Food Insecurity: No Food Insecurity (11/20/2023)   Hunger Vital Sign    Worried About Running Out of Food in the Last Year: Never true    Ran Out of Food in the Last Year: Never true  Transportation Needs: No Transportation Needs (11/20/2023)   PRAPARE - Administrator, Civil Service (Medical): No    Lack of Transportation (Non-Medical): No  Physical Activity: Not on file  Stress: Not on file  Social Connections: Not on file   Family History  Problem Relation Age of Onset   Hypertension Father    Diabetes Father    Allergies  Allergen Reactions   Doxycycline Anaphylaxis   Prior to Admission medications   Medication Sig Start Date End Date Taking? Authorizing Provider  acetaminophen  (TYLENOL ) 500 MG tablet Take 1,000 mg by mouth every 6 (six) hours as needed for mild pain (pain score  1-3) or moderate pain (pain score 4-6).   Yes [provider]  sulfamethoxazole -trimethoprim  (BACTRIM  DS) 800-160 MG tablet Take 1 tablet by mouth 2 (two) times daily for 7 days. 11/18/23 11/25/23 Yes Kent Pear, NP  fenofibrate (TRICOR) 145 MG tablet Take 1 tablet by mouth daily. Patient not taking: Reported on 11/20/2023    [provider]  FLUoxetine (PROZAC) 20 MG capsule Take 20 mg by mouth in the morning. Patient not taking: Reported on 11/20/2023 10/30/23   [provider]  hydrochlorothiazide (HYDRODIURIL) 25 MG tablet Take 25 mg by mouth in the  morning. Patient not taking: Reported on 11/20/2023    [provider]  Omega-3 1000 MG CAPS Take 2 g by mouth 2 (two) times daily. Patient not taking: Reported on 11/20/2023    [provider]  QUEtiapine (SEROQUEL) 50 MG tablet Take 50 mg by mouth at bedtime. Patient not taking: Reported on 11/20/2023 10/30/23   [provider]   CT FOREARM LEFT W CONTRAST Result Date: 11/19/2023 CLINICAL DATA:  Soft tissue infection suspected. Infection abscess to left wrist. Bit by spider 2 days ago. Placed on Bactrim . Left wrist and forearm are swollen and red. EXAM: CT OF THE UPPER LEFT EXTREMITY WITH CONTRAST TECHNIQUE: Multidetector CT imaging of the upper left extremity was performed according to the standard protocol following intravenous contrast administration. RADIATION DOSE REDUCTION: This exam was performed according to the departmental dose-optimization program which includes automated exposure control, adjustment of the mA and/or kV according to patient size and/or use of iterative reconstruction technique. CONTRAST:  75mL OMNIPAQUE  IOHEXOL  300 MG/ML  SOLN COMPARISON:  None Available. FINDINGS: Bones/Joint/Cartilage No acute fracture or evidence of osteomyelitis. Ligaments Suboptimally assessed by CT. Muscles and Tendons Mild edema within the flexor compartment musculature is likely reactive secondary to adjacent subcutaneous inflammation and phlegmon. No intramuscular abscess or gas. Soft tissues Within the subcutaneous soft tissues of the radial aspect of the distal forearm there is marked edema centered about a peripherally enhancing irregular fluid collection which measures 2.0 x 1.4 cm. There is a tract extending to the skin surface. No soft tissue gas. IMPRESSION: 2.0 cm phlegmon/developing abscess in the subcutaneous soft tissues of the radial aspect of the distal forearm with a tract extending to the skin surface. Adjacent cellulitis. Electronically Signed   By: Rozell Cornet  M.D.   On: 11/19/2023 22:42    Positive ROS: All other systems have been reviewed and were otherwise negative with the exception of those mentioned in the HPI and as above.  Physical Exam: General:  Alert, no acute distress Psychiatric:  Patient is competent for consent with normal mood and affect   Cardiovascular:  No pedal edema Respiratory:  No wheezing, non-labored breathing GI:  Abdomen is soft and non-tender Skin:  No lesions in the area of chief complaint Neurologic:  Sensation intact distally Lymphatic:  No axillary or cervical lymphadenopathy  Orthopedic Exam:  Left upper extremity There is a 2 cm area of open wound on the volar aspect of the forearm with small amount of purulent material and granulation tissue present.  Mild active oozing.  Mild surrounding fullness no obvious fluctuance or other fluid collections around the forearm Compartments all soft Neurovascularly intact Intact radial pulse able to move all fingers  Bedside procedure After obtaining verbal consent from the patient and reviewing the risks and benefits of the bedside open debridement the patient agreed to move forward with the procedure.  The forearm was washed with Betadine  and normal saline and then a small amount of pressure was applied around the edges of the collection and purulent material was able to be expressed as the wound was open.  A few cc of purulent material were expressed as well as the covering granulation tissue removed.  The wound was then irrigated with copious saline & Betadine and then again saline.  No more fluid collections were found and no other purulence was able to be expressed.  A dry dressing was applied the patient tolerated the procedure well.  X-rays:  CT scan reviewed full read above agree with radiology interpretation 2 or so centimeter abscess in the subcutaneous tissues of the left forearm.  Assessment: Left forearm abscess status post irrigation debridement  Plan: I  reviewed the clinical and radiographic findings with the patient.  It appears that the wound had resealed shut after being opened in the emergency room.  Patient tolerated at bedside gentle debridement this evening.  The wound now has a clean bed and I covered with dry gauze to allow it to stay open to drain.  I did discuss with the patient should recollect worsen tomorrow we may need to move forward with a more formal surgical debridement.  Will keep him n.p.o. after midnight continue with IV antibiotics.  I will see him tomorrow and evaluate the wound if  we feel it needs more formal debridement then we will take it to the operating room tomorrow. Keep the arm elevated above heart. All this was discussed with the patient and the care team and everyone agrees with the above plan.    Venus Ginsberg MD  Beeper #:  [phone]  11/20/2023 6:11 PM

## 2023-11-21 DIAGNOSIS — L02414 Cutaneous abscess of left upper limb: Secondary | ICD-10-CM | POA: Diagnosis not present

## 2023-11-21 LAB — BASIC METABOLIC PANEL WITH GFR
Anion gap: 9 (ref 5–15)
BUN: 14 mg/dL (ref 6–20)
CO2: 22 mmol/L (ref 22–32)
Calcium: 8.2 mg/dL — ABNORMAL LOW (ref 8.9–10.3)
Chloride: 103 mmol/L (ref 98–111)
Creatinine, Ser: 0.55 mg/dL — ABNORMAL LOW (ref 0.61–1.24)
GFR, Estimated: >60 mL/min (ref >60)
Glucose, Bld: 87 mg/dL (ref 70–99)
Potassium: 3.7 mmol/L (ref 3.5–5.1)
Sodium: 134 mmol/L — ABNORMAL LOW (ref 135–145)

## 2023-11-21 LAB — CBC
HCT: 41 % (ref 39.0–52.0)
Hemoglobin: 14.2 g/dL (ref 13.0–17.0)
MCH: 30.9 pg (ref 26.0–34.0)
MCHC: 34.6 g/dL (ref 30.0–36.0)
MCV: 89.1 fL (ref 80.0–100.0)
Platelets: 313 10*3/uL (ref 150–400)
RBC: 4.6 MIL/uL (ref 4.22–5.81)
RDW: 11.8 % (ref 11.5–15.5)
WBC: 9 10*3/uL (ref 4.0–10.5)
nRBC: 0 % (ref 0.0–0.2)

## 2023-11-21 LAB — MAGNESIUM: Magnesium: 2.2 mg/dL (ref 1.7–2.4)

## 2023-11-21 MED ORDER — SODIUM CHLORIDE 0.9 % IV SOLN
2.0000 g | INTRAVENOUS | Status: DC
  Administered 2023-11-21: 2 g via INTRAVENOUS
  Filled 2023-11-21 (×2): qty 20

## 2023-11-21 NOTE — Plan of Care (Signed)

## 2023-11-21 NOTE — TOC Progression Note (Signed)
 Transition of Care [facility]) - Progression Note    Patient Details  Name: Eric Clarke MRN: [id number] Date of Birth: 15-Jun-1991  Transition of Care [facility] CM/SW Contact  Crayton Docker, RN Phone Number: 11/21/2023, 1:13 PM  Clinical Narrative:      CM follow up with Dr. Gordy Lauber regarding discharge care planning. Per Dr. Gordy Lauber, patient will continue IV ABT of Vancomycin  and Cefepime  and then discharge to home on oral antibiotics.       Expected Discharge Plan and Services    Home    Social Determinants of Health (SDOH) Interventions SDOH Screenings   Food Insecurity: No Food Insecurity (11/20/2023)  Housing: Unknown (11/20/2023)  Transportation Needs: No Transportation Needs (11/20/2023)  Utilities: Not At Risk (11/20/2023)  Financial Resource Strain: Low Risk  (10/31/2022)   Received from [facility]  Tobacco Use: High Risk (11/19/2023)    Readmission Risk Interventions     No data to display

## 2023-11-21 NOTE — Progress Notes (Signed)
  PROGRESS NOTE    [name]  Eric Clarke:[id number] DOB: 11/05/1990 DOA: 11/19/2023 PCP: Patient, No Pcp Per  138A/138A-AA  LOS: 1 day   Brief hospital course:   Assessment & Plan: Eric Clarke is a 33 y.o. male with medical history significant of  IVDU, former polysubstance abuse, tobacco abuse, left septic hip joint, who presents with left forearm and wrist pain, diarrhea   Patient states that he possibly had spider bite to his left forearm while helping his father move furniture outside 3 days ago, then developed pain, swelling and erythema in that area.   Abscess of left forearm with celluilitis:  S/p of I&D by EDP S/p further bedside I/D by ortho --started on vancomycin  and cefepime  (patient received 1 dose of Rocephin  in ED) --cont Vanc --switch from cefepime  and ceftriaxone  --no need for further I/D --Percocet PRN for pain   Sepsis --tachycardia ane leukocytosis, source cellulitis  hx of polysubstance abuse [facility]):  Patient has hx of IV drug use, but states he has been clean for the past 6 months. - UDS pos for amphetamine and opiate   Tobacco abuse - Did counseling about importance of quitting smoking by admitter --cont nicotine  patch   Diarrhea --no diarrhea since presentation  Hypokalemia --monitor and supplement PRN   DVT prophylaxis: Lovenox  SQ Code Status: Full code  Family Communication: wife updated at bedside today Level of care: Med-Surg Dispo:   The patient is from: home Anticipated d/c is to: home Anticipated d/c date is: 1-2 days   Subjective and Interval History:  Pt reported continued pain.    Per ortho, no need for further I/D.   Objective: Vitals:   11/20/23 2011 11/21/23 0453 11/21/23 0901 11/21/23 1549  BP: 119/72 121/76 131/89 (!) 143/82  Pulse: 65 63 68 76  Resp: 18 19 16 16   Temp: 98 F (36.7 C) (!) 97.5 F (36.4 C) 97.7 F (36.5 C) 98.4 F (36.9 C)  TempSrc: Oral Oral    SpO2: 96% 98% 100% 98%  Weight:       Height:        Intake/Output Summary (Last 24 hours) at 11/21/2023 1734 Last data filed at 11/21/2023 1626 Gross per 24 hour  Intake 940.96 ml  Output 1200 ml  Net -259.04 ml   Filed Weights   11/19/23 1951  Weight: 67.6 kg    Examination:   Constitutional: NAD, AAOx3 HEENT: conjunctivae and lids normal, EOMI CV: No cyanosis.   RESP: normal respiratory effort, on RA Extremities: left wrist wrapped   Data Reviewed: I have personally reviewed labs and imaging studies  Time spent: 35 minutes  Garrison Kanner, MD [facility] If 7PM-7AM, please contact night-coverage 11/21/2023, 5:34 PM

## 2023-11-21 NOTE — Plan of Care (Signed)
   Problem: Health Behavior/Discharge Planning: Goal: Ability to manage health-related needs will improve Outcome: Progressing   Problem: Clinical Measurements: Goal: Diagnostic test results will improve Outcome: Progressing

## 2023-11-21 NOTE — Progress Notes (Signed)
 Subjective: Patient seen and examined at bedside.  Left volar forearm and wrist significantly improved from yesterday with decreased erythema and swelling.  Decree swelling in the hand and per patient improved pain.  No fevers overnight.  Continues on IV antibiotics.  Denies any new systemic symptoms.   Objective: Vital signs in last 24 hours: Temp:  [97.5 F (36.4 C)-98.1 F (36.7 C)] 97.7 F (36.5 C) (04/22 0901) Pulse Rate:  [63-71] 68 (04/22 0901) Resp:  [16-19] 16 (04/22 0901) BP: (119-131)/(72-89) 131/89 (04/22 0901) SpO2:  [96 %-100 %] 100 % (04/22 0901)  Intake/Output from previous day: 04/21 0701 - 04/22 0700 In: 690.1 [P.O.:240; IV Piggyback:450.1] Out: 900 [Urine:900] Intake/Output this shift: Total I/O In: 350 [IV Piggyback:350] Out: 300 [Urine:300]  Recent Labs    11/19/23 2047 11/20/23 0123 11/21/23 0347  HGB 14.6 12.5* 14.2   Recent Labs    11/20/23 0123 11/21/23 0347  WBC 14.8* 9.0  RBC 4.15* 4.60  HCT 36.9* 41.0  PLT 368 313   Recent Labs    11/20/23 0123 11/21/23 0347  NA 135 134*  K 3.4* 3.7  CL 105 103  CO2 24 22  BUN 16 14  CREATININE 0.70 0.55*  GLUCOSE 116* 87  CALCIUM 7.4* 8.2*   No results for input(s): "LABPT", "INR" in the last 72 hours.  Physical Exam: Left upper extremity 2 cm wound over the volar wrist improved from yesterday with decreased swelling and a clean healthy appearing base with healthy granulation tissue and no active purulent drainage Erythema from around the incision edge is decreased and improved from yesterday No areas of fluctuance palpable No expressible purulence from the wound itself New clean gauze dressing applied with an Ace wrap Neurovascularly intact with a intact radial pulse with brisk capillary refill to the fingertips   Assessment: Left forearm abscess status post debridement and drainage  Plan: I reviewed the clinical findings with the patient.  The wound is significantly improved today with  decreased erythema and swelling and appears to be beginning to heal well.  We discussed the nature of secondary healing and the importance of keeping the wound opened and allowed to drain while it is healing to clear any residual infection.  I did not see any contained fluid collections or remaining abscess which would require incision and drainage.  No plan for orthopedic surgical intervention on the left arm at this time continue with IV antibiotics and dry dressing changes.  Likely will be okay to transition to oral antibiotics within the next 24 hours with outpatient follow-up in my office within 5 days for a wound check.  All questions answered and follow-up information was given to the patient.   Eric Clarke 11/21/2023, 12:39 PM

## 2023-11-22 ENCOUNTER — Other Ambulatory Visit (HOSPITAL_COMMUNITY): Payer: Self-pay

## 2023-11-22 ENCOUNTER — Telehealth (HOSPITAL_COMMUNITY): Payer: Self-pay | Admitting: Pharmacy Technician

## 2023-11-22 DIAGNOSIS — L02414 Cutaneous abscess of left upper limb: Secondary | ICD-10-CM | POA: Diagnosis not present

## 2023-11-22 LAB — BASIC METABOLIC PANEL WITH GFR
Anion gap: 8 (ref 5–15)
BUN: 11 mg/dL (ref 6–20)
CO2: 24 mmol/L (ref 22–32)
Calcium: 8.5 mg/dL — ABNORMAL LOW (ref 8.9–10.3)
Chloride: 107 mmol/L (ref 98–111)
Creatinine, Ser: 0.55 mg/dL — ABNORMAL LOW (ref 0.61–1.24)
GFR, Estimated: >60 mL/min (ref >60)
Glucose, Bld: 85 mg/dL (ref 70–99)
Potassium: 3.8 mmol/L (ref 3.5–5.1)
Sodium: 139 mmol/L (ref 135–145)

## 2023-11-22 LAB — AEROBIC CULTURE W GRAM STAIN (SUPERFICIAL SPECIMEN)

## 2023-11-22 LAB — CBC
HCT: 41.3 % (ref 39.0–52.0)
Hemoglobin: 14.3 g/dL (ref 13.0–17.0)
MCH: 30.6 pg (ref 26.0–34.0)
MCHC: 34.6 g/dL (ref 30.0–36.0)
MCV: 88.2 fL (ref 80.0–100.0)
Platelets: 454 10*3/uL — ABNORMAL HIGH (ref 150–400)
RBC: 4.68 MIL/uL (ref 4.22–5.81)
RDW: 11.9 % (ref 11.5–15.5)
WBC: 9 10*3/uL (ref 4.0–10.5)
nRBC: 0 % (ref 0.0–0.2)

## 2023-11-22 LAB — MAGNESIUM: Magnesium: 2.1 mg/dL (ref 1.7–2.4)

## 2023-11-22 NOTE — Plan of Care (Signed)
  Problem: Clinical Measurements: Goal: Respiratory complications will improve Outcome: Adequate for Discharge Goal: Cardiovascular complication will be avoided Outcome: Adequate for Discharge   Problem: Activity: Goal: Risk for activity intolerance will decrease Outcome: Adequate for Discharge   Problem: Pain Managment: Goal: General experience of comfort will improve and/or be controlled Outcome: Progressing

## 2023-11-22 NOTE — Progress Notes (Signed)
 Went to patient room at 13:45, patient not in room, notified MD.  MD spoke to patient @12 :30 and told him he would be discharging after they figured out his antibiotics. Called patient's phone and his wife's phone. Notified charge nurse that patient was missing, searched room for IV but did not find. Notified International aid/development worker and they called security.

## 2023-11-22 NOTE — Telephone Encounter (Signed)
 Patient Product/process development scientist completed.    The patient is insured through [employer].     Ran test claim for linezolid 600 mg and the current 14 day co-pay is $0.00.   This test claim was processed through [facility]- copay amounts may vary at other pharmacies due to pharmacy/plan contracts, or as the patient moves through the different stages of their insurance plan.     Morgan Arab, CPHT Pharmacy Technician III Certified Patient Advocate [facility] Patient Advocate Team Direct Number: [phone]  Fax: [phone]

## 2023-11-22 NOTE — Discharge Summary (Addendum)
 Physician AMA Discharge Summary   Eric Clarke  male DOB: 1991/04/23  ZOX:[id number]  PCP: Patient, No Pcp Per  Admit date: 11/19/2023 Discharge date: Pt unexpectedly left the hospital early afternoon on 11/22/23 without letting anyone know.  RN made multiple calls to pt and wife without answer.  Admitted From: home CODE STATUS: Full code   Hospital Course:  For full details, please see H&P, progress notes, consult notes and ancillary notes.  Briefly,  CLEBERT Clarke is a 33 y.o. male with medical history significant of IVDU, polysubstance abuse, tobacco abuse, left septic hip joint, who presented with left forearm and wrist pain, diarrhea   Patient states that he possibly had a spider bite to his left forearm while helping his father move furniture outside 3 days ago, then developed pain, swelling and erythema in that area.    Abscess of left forearm with celluilitis:  S/p of I&D by EDP S/p further bedside I/D by ortho on 11/20/23 --started on vancomycin  and cefepime  (patient received 1 dose of Rocephin  in ED) then switched from cefepime  and ceftriaxone .  Pt received 3 days of IV abx. --abscess cx pos for MRSA.  Discussed with ID pharm, and plan was to discharge pt on Linezolid, however, pt left the hospital unexpected without warning.   Sepsis --tachycardia ane leukocytosis, source cellulitis with abscess   hx of polysubstance abuse [facility]):  Patient has hx of IV drug use, but states he has been clean for the past 6 months. - UDS pos for amphetamine and opiate   Tobacco abuse - Did counseling about importance of quitting smoking by admitter --declined nicotine  patch   Diarrhea --no diarrhea since presentation   Hypokalemia --monitored and supplemented PRN   Discharge Diagnoses:  Principal Problem:   Abscess of left forearm with celluilitis Active Problems:   hx of polysubstance abuse (HCC)   Tobacco abuse   Diarrhea   Cellulitis   30 Day Unplanned  Readmission Risk Score    Flowsheet Row ED to Hosp-Admission (Current) from 11/19/2023 in [facility] (1A)  30 Day Unplanned Readmission Risk Score (%) 19.8 Filed at 11/22/2023 1201       This score is the patient's risk of an unplanned readmission within 30 days of being discharged (0 -100%). The score is based on dignosis, age, lab data, medications, orders, and past utilization.   Low:  0-14.9   Medium: 15-21.9   High: 22-29.9   Extreme: 30 and above         Discharge Instructions:  Med rec not done since pt left AMA without warning.    Allergies  Allergen Reactions   Doxycycline Anaphylaxis     The results of significant diagnostics from this hospitalization (including imaging, microbiology, ancillary and laboratory) are listed below for reference.   Consultations:   Procedures/Studies: CT FOREARM LEFT W CONTRAST Result Date: 11/19/2023 CLINICAL DATA:  Soft tissue infection suspected. Infection abscess to left wrist. Bit by spider 2 days ago. Placed on Bactrim . Left wrist and forearm are swollen and red. EXAM: CT OF THE UPPER LEFT EXTREMITY WITH CONTRAST TECHNIQUE: Multidetector CT imaging of the upper left extremity was performed according to the standard protocol following intravenous contrast administration. RADIATION DOSE REDUCTION: This exam was performed according to the departmental dose-optimization program which includes automated exposure control, adjustment of the mA and/or kV according to patient size and/or use of iterative reconstruction technique. CONTRAST:  75mL OMNIPAQUE  IOHEXOL  300 MG/ML  SOLN COMPARISON:  None  Available. FINDINGS: Bones/Joint/Cartilage No acute fracture or evidence of osteomyelitis. Ligaments Suboptimally assessed by CT. Muscles and Tendons Mild edema within the flexor compartment musculature is likely reactive secondary to adjacent subcutaneous inflammation and phlegmon. No intramuscular abscess or gas. Soft  tissues Within the subcutaneous soft tissues of the radial aspect of the distal forearm there is marked edema centered about a peripherally enhancing irregular fluid collection which measures 2.0 x 1.4 cm. There is a tract extending to the skin surface. No soft tissue gas. IMPRESSION: 2.0 cm phlegmon/developing abscess in the subcutaneous soft tissues of the radial aspect of the distal forearm with a tract extending to the skin surface. Adjacent cellulitis. Electronically Signed   By: Rozell Cornet M.D.   On: 11/19/2023 22:42      Labs: BNP (last 3 results) No results for input(s): "BNP" in the last 8760 hours. Basic Metabolic Panel: Recent Labs  Lab 11/19/23 2047 11/20/23 0123 11/21/23 0347 11/22/23 0441  NA 136 135 134* 139  K 3.9 3.4* 3.7 3.8  CL 103 105 103 107  CO2 25 24 22 24   GLUCOSE 117* 116* 87 85  BUN 18 16 14 11   CREATININE 0.99 0.70 0.55* 0.55*  CALCIUM 8.8* 7.4* 8.2* 8.5*  MG  --   --  2.2 2.1   Liver Function Tests: Recent Labs  Lab 11/19/23 2047  AST 20  ALT 28  ALKPHOS 62  BILITOT 0.7  PROT 7.5  ALBUMIN 4.0   No results for input(s): "LIPASE", "AMYLASE" in the last 168 hours. No results for input(s): "AMMONIA" in the last 168 hours. CBC: Recent Labs  Lab 11/19/23 2047 11/20/23 0123 11/21/23 0347 11/22/23 0441  WBC 15.8* 14.8* 9.0 9.0  NEUTROABS 10.6*  --   --   --   HGB 14.6 12.5* 14.2 14.3  HCT 43.6 36.9* 41.0 41.3  MCV 90.8 88.9 89.1 88.2  PLT 402* 368 313 454*   Cardiac Enzymes: No results for input(s): "CKTOTAL", "CKMB", "CKMBINDEX", "TROPONINI" in the last 168 hours. BNP: Invalid input(s): "POCBNP" CBG: No results for input(s): "GLUCAP" in the last 168 hours. D-Dimer No results for input(s): "DDIMER" in the last 72 hours. Hgb A1c No results for input(s): "HGBA1C" in the last 72 hours. Lipid Profile No results for input(s): "CHOL", "HDL", "LDLCALC", "TRIG", "CHOLHDL", "LDLDIRECT" in the last 72 hours. Thyroid function studies No  results for input(s): "TSH", "T4TOTAL", "T3FREE", "THYROIDAB" in the last 72 hours.  Invalid input(s): "FREET3" Anemia work up No results for input(s): "VITAMINB12", "FOLATE", "FERRITIN", "TIBC", "IRON", "RETICCTPCT" in the last 72 hours. Urinalysis    Component Value Date/Time   COLORURINE YELLOW (A) 05/30/2019 1734   APPEARANCEUR HAZY (A) 05/30/2019 1734   LABSPEC 1.024 05/30/2019 1734   PHURINE 5.0 05/30/2019 1734   GLUCOSEU NEGATIVE 05/30/2019 1734   HGBUR NEGATIVE 05/30/2019 1734   BILIRUBINUR NEGATIVE 05/30/2019 1734   KETONESUR NEGATIVE 05/30/2019 1734   PROTEINUR 30 (A) 05/30/2019 1734   NITRITE NEGATIVE 05/30/2019 1734   LEUKOCYTESUR NEGATIVE 05/30/2019 1734   Sepsis Labs Recent Labs  Lab 11/19/23 2047 11/20/23 0123 11/21/23 0347 11/22/23 0441  WBC 15.8* 14.8* 9.0 9.0   Microbiology Recent Results (from the past 240 hours)  Culture, blood (routine x 2)     Status: None (Preliminary result)   Collection Time: 11/19/23  8:47 PM   Specimen: BLOOD  Result Value Ref Range Status   Specimen Description BLOOD BLOOD RIGHT ARM  Final   Special Requests   Final  BOTTLES DRAWN AEROBIC AND ANAEROBIC Blood Culture adequate volume   Culture   Final    NO GROWTH 3 DAYS Performed at [facility], [address] [place], [place] [address]    Report Status PENDING  Incomplete  Culture, blood (routine x 2)     Status: None (Preliminary result)   Collection Time: 11/19/23  8:47 PM   Specimen: BLOOD  Result Value Ref Range Status   Specimen Description BLOOD BLOOD RIGHT ARM  Final   Special Requests   Final    BOTTLES DRAWN AEROBIC AND ANAEROBIC Blood Culture adequate volume   Culture   Final    NO GROWTH 3 DAYS Performed at [facility], [address], [place], [place] [address]    Report Status PENDING  Incomplete  Aerobic Culture w Gram Stain (superficial specimen)     Status: None   Collection Time: 11/19/23 11:44 PM   Specimen: Wound  Result  Value Ref Range Status   Specimen Description   Final    WOUND WOUND Performed at [facility], [address] [place], [place] [address]    Special Requests   Final    NONE Performed at [facility], [address] [place], [place] [address]    Gram Stain   Final    RARE WBC PRESENT, PREDOMINANTLY PMN RARE GRAM POSITIVE COCCI Performed at [facility], [address] [place], [place] [address]    Culture   Final    ABUNDANT METHICILLIN RESISTANT STAPHYLOCOCCUS AUREUS   Report Status 11/22/2023 FINAL  Final   Organism ID, Bacteria METHICILLIN RESISTANT STAPHYLOCOCCUS AUREUS  Final      Susceptibility   Methicillin resistant staphylococcus aureus - MIC*    CIPROFLOXACIN >=8 RESISTANT Resistant     ERYTHROMYCIN >=8 RESISTANT Resistant     GENTAMICIN <=0.5 SENSITIVE Sensitive     OXACILLIN >=4 RESISTANT Resistant     TETRACYCLINE <=1 SENSITIVE Sensitive     VANCOMYCIN  <=0.5 SENSITIVE Sensitive     TRIMETH /SULFA  80 RESISTANT Resistant     CLINDAMYCIN  <=0.25 SENSITIVE Sensitive     RIFAMPIN <=0.5 SENSITIVE Sensitive     Inducible Clindamycin  NEGATIVE Sensitive     LINEZOLID 2 SENSITIVE Sensitive     * ABUNDANT METHICILLIN RESISTANT STAPHYLOCOCCUS AUREUS  MRSA Next Gen by PCR, Nasal     Status: Abnormal   Collection Time: 11/20/23  8:40 AM   Specimen: Nasal Mucosa; Nasal Swab  Result Value Ref Range Status   MRSA by PCR Next Gen DETECTED (A) NOT DETECTED Final    Comment: RESULT CALLED TO, READ BACK BY AND VERIFIED WITH: EVAN CHEEK @1051  11/20/23 MJU (NOTE) The GeneXpert MRSA Assay (FDA approved for NASAL specimens only), is one component of a comprehensive MRSA colonization surveillance program. It is not intended to diagnose MRSA infection nor to guide or monitor treatment for MRSA infections. Test performance is not FDA approved in patients less than 72 years old. Performed at [facility], [address], [place], [place] [address]       Charged as level 2.   Garrison Kanner, MD  [facility] 11/22/2023, 2:19 PM

## 2023-11-24 LAB — CULTURE, BLOOD (ROUTINE X 2)
Culture: NO GROWTH
Culture: NO GROWTH
Special Requests: ADEQUATE
Special Requests: ADEQUATE

## 2024-01-15 IMAGING — CR DG CHEST 2V
1 series · 2 of 2 positions shown · non-contrast
Comparison: Prior chest radiographs 07/09/2021 and earlier.

CLINICAL DATA: Provided history: Chest pain, shortness of breath.
Additional history provided: Current smoker, productive cough.

EXAM:
CHEST - 2 VIEW

[Series 1: dg chest 2 view · 0.14mm/px · 2 of 2 slices shown]
[im 1/2]
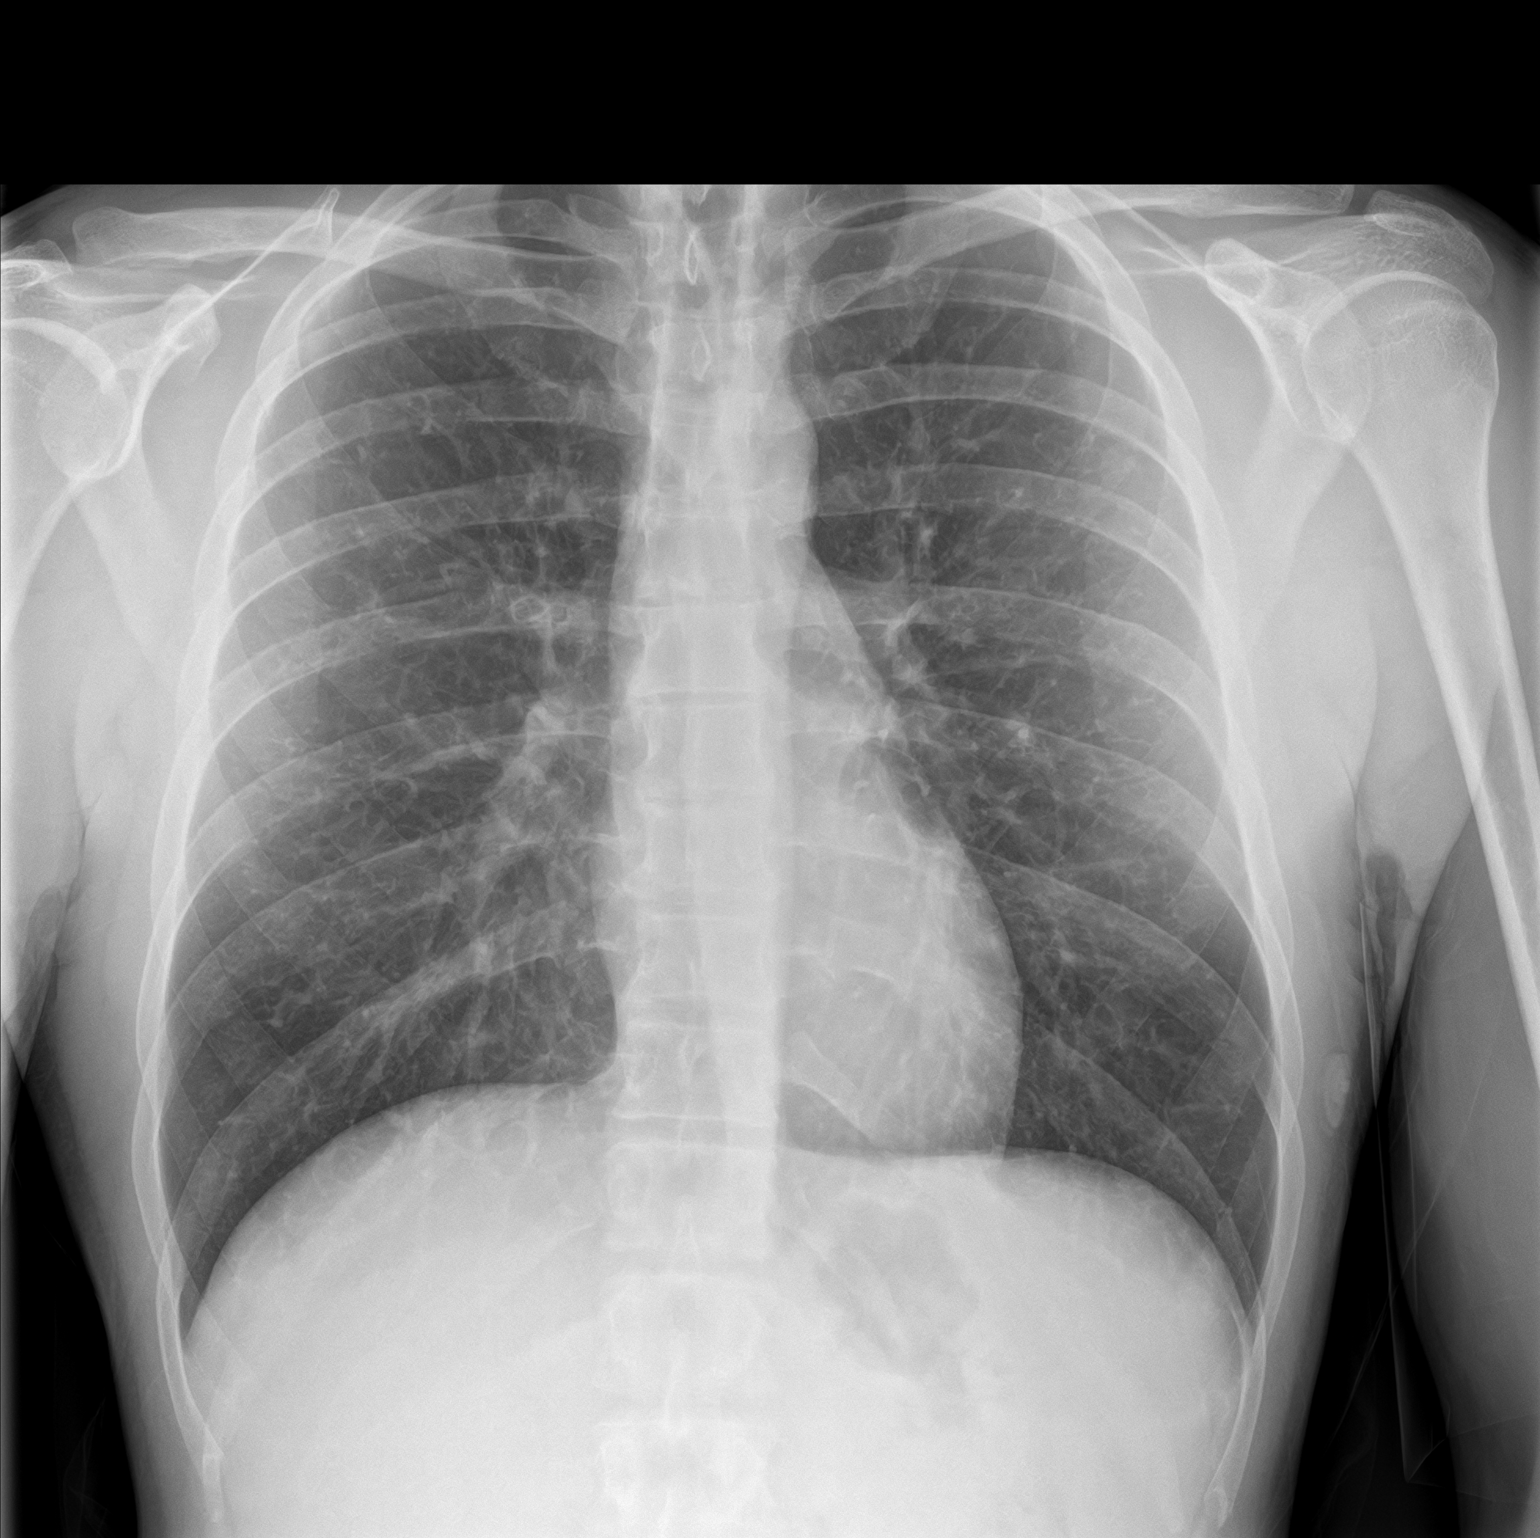
[im 2/2]
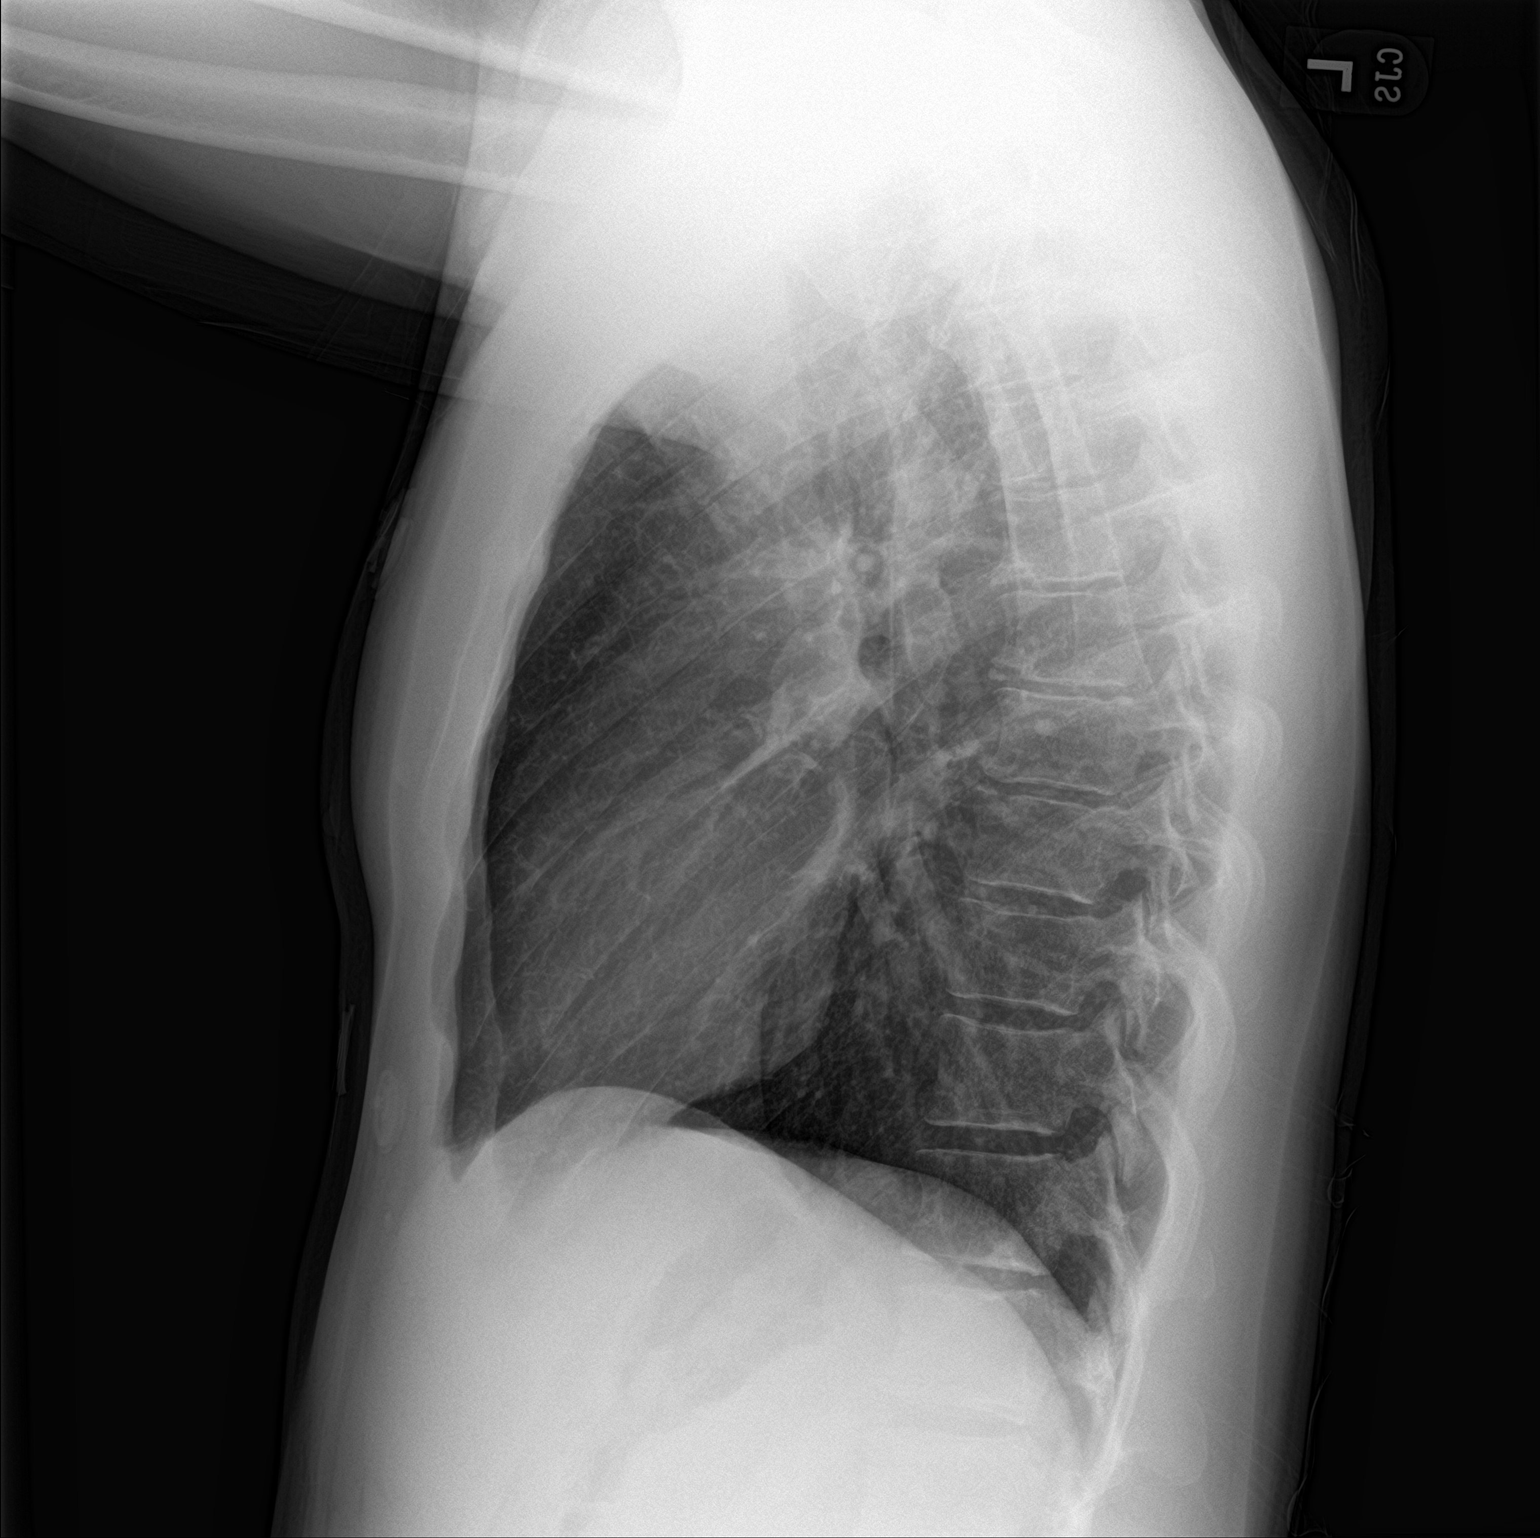

[2 of 2 positions shown; findings below may reference images not displayed]

FINDINGS: Heart size within normal limits. Peribronchial thickening. No
appreciable airspace consolidation. No evidence of pleural effusion
or pneumothorax. No acute bony abnormality identified.
IMPRESSION: Peribronchial thickening. This finding may be seen in setting of
viral infection, reactive airway disease/asthma or other chronic
airway inflammation.

No appreciable airspace consolidation.

## 2024-02-13 ENCOUNTER — Other Ambulatory Visit: Payer: Self-pay | Admitting: Podiatry

## 2024-02-13 NOTE — Patient Instructions (Signed)
 I have recommended taking Tylenol  500 mg and ibuprofen  400 mg every 6 hours.  These can be taken together to assist with pain management.

## 2024-02-15 MED ORDER — LACTATED RINGERS IV SOLN: INTRAVENOUS | Status: DC

## 2024-02-15 MED ORDER — CEFAZOLIN SODIUM-DEXTROSE 2-4 GM/100ML-% IV SOLN
2.0000 g | INTRAVENOUS | Status: AC
  Administered 2024-02-16: 2 g via INTRAVENOUS

## 2024-02-15 MED ORDER — VANCOMYCIN HCL IN DEXTROSE 1-5 GM/200ML-% IV SOLN
1000.0000 mg | INTRAVENOUS | Status: AC
  Administered 2024-02-16: 1000 mg via INTRAVENOUS

## 2024-02-15 MED ORDER — ORAL CARE MOUTH RINSE: 15.0000 mL | Freq: Once | OROMUCOSAL | Status: AC

## 2024-02-15 MED ORDER — CHLORHEXIDINE GLUCONATE 0.12 % MT SOLN
15.0000 mL | Freq: Once | OROMUCOSAL | Status: AC
  Administered 2024-02-16: 15 mL via OROMUCOSAL

## 2024-02-16 ENCOUNTER — Other Ambulatory Visit: Payer: Self-pay

## 2024-02-16 ENCOUNTER — Encounter: Payer: Self-pay | Admitting: Podiatry

## 2024-02-16 ENCOUNTER — Ambulatory Visit: Admitting: Anesthesiology

## 2024-02-16 ENCOUNTER — Ambulatory Visit

## 2024-02-16 ENCOUNTER — Ambulatory Visit
Admission: RE | Admit: 2024-02-16 | Discharge: 2024-02-16 | Disposition: A | Discharge status: Home / Self Care | Attending: Podiatry | Admitting: Podiatry

## 2024-02-16 ENCOUNTER — Encounter
Admission: RE | Disposition: A | Discharge status: Home / Self Care | Payer: Self-pay | Source: Home / Self Care | Attending: Podiatry

## 2024-02-16 DIAGNOSIS — Z87891 Personal history of nicotine dependence: Secondary | ICD-10-CM | POA: Diagnosis not present

## 2024-02-16 DIAGNOSIS — X58XXXA Exposure to other specified factors, initial encounter: Secondary | ICD-10-CM | POA: Insufficient documentation

## 2024-02-16 DIAGNOSIS — S92351A Displaced fracture of fifth metatarsal bone, right foot, initial encounter for closed fracture: Secondary | ICD-10-CM | POA: Diagnosis present

## 2024-02-16 DIAGNOSIS — L02414 Cutaneous abscess of left upper limb: Secondary | ICD-10-CM

## 2024-02-16 DIAGNOSIS — F1910 Other psychoactive substance abuse, uncomplicated: Secondary | ICD-10-CM

## 2024-02-16 HISTORY — PX: ORIF TOE FRACTURE: SHX5032

## 2024-02-16 SURGERY — OPEN REDUCTION INTERNAL FIXATION (ORIF) METATARSAL (TOE) FRACTURE
Anesthesia: General | Site: Fifth Toe | Laterality: Right

## 2024-02-16 MED ORDER — EPHEDRINE SULFATE-NACL 50-0.9 MG/10ML-% IV SOSY
PREFILLED_SYRINGE | INTRAVENOUS | Status: DC | PRN
Start: 2024-02-16 — End: 2024-02-16
  Administered 2024-02-16: 5 mg via INTRAVENOUS

## 2024-02-16 MED ORDER — CHLORHEXIDINE GLUCONATE 4 % EX SOLN: 1.0000 | CUTANEOUS | 1 refills | Status: AC

## 2024-02-16 MED ORDER — MUPIROCIN 2 % EX OINT: 1.0000 | TOPICAL_OINTMENT | Freq: Two times a day (BID) | CUTANEOUS | 0 refills | Status: AC

## 2024-02-16 MED ORDER — LACTATED RINGERS IV SOLN: INTRAVENOUS | Status: DC | PRN

## 2024-02-16 MED ORDER — LIDOCAINE HCL (PF) 1 % IJ SOLN
INTRAMUSCULAR | Status: AC
  Filled 2024-02-16: qty 30

## 2024-02-16 MED ORDER — PROPOFOL 10 MG/ML IV BOLUS
INTRAVENOUS | Status: DC | PRN
  Administered 2024-02-16 (×2): 40 mg via INTRAVENOUS
  Administered 2024-02-16: 160 mg via INTRAVENOUS

## 2024-02-16 MED ORDER — PROPOFOL 10 MG/ML IV BOLUS
INTRAVENOUS | Status: AC
  Filled 2024-02-16: qty 20

## 2024-02-16 MED ORDER — MIDAZOLAM HCL 2 MG/2ML IJ SOLN
INTRAMUSCULAR | Status: AC
  Filled 2024-02-16: qty 2

## 2024-02-16 MED ORDER — ONDANSETRON HCL 4 MG/2ML IJ SOLN: 4.0000 mg | Freq: Once | INTRAMUSCULAR | Status: DC | PRN

## 2024-02-16 MED ORDER — DEXAMETHASONE SODIUM PHOSPHATE 10 MG/ML IJ SOLN
INTRAMUSCULAR | Status: DC | PRN
  Administered 2024-02-16: 10 mg via INTRAVENOUS

## 2024-02-16 MED ORDER — IBUPROFEN 200 MG PO TABS: 400.0000 mg | ORAL_TABLET | ORAL | 0 refills | Status: AC | PRN

## 2024-02-16 MED ORDER — ACETAMINOPHEN 10 MG/ML IV SOLN
INTRAVENOUS | Status: DC | PRN
  Administered 2024-02-16: 1000 mg via INTRAVENOUS

## 2024-02-16 MED ORDER — LIDOCAINE HCL (PF) 2 % IJ SOLN
INTRAMUSCULAR | Status: AC
  Filled 2024-02-16: qty 5

## 2024-02-16 MED ORDER — METOCLOPRAMIDE HCL 10 MG PO TABS: 5.0000 mg | ORAL_TABLET | Freq: Three times a day (TID) | ORAL | Status: DC | PRN

## 2024-02-16 MED ORDER — ONDANSETRON HCL 4 MG/2ML IJ SOLN
INTRAMUSCULAR | Status: AC
Start: 2024-02-16 — End: 2024-02-16
  Filled 2024-02-16: qty 2

## 2024-02-16 MED ORDER — METOCLOPRAMIDE HCL 5 MG/ML IJ SOLN: 5.0000 mg | Freq: Three times a day (TID) | INTRAMUSCULAR | Status: DC | PRN

## 2024-02-16 MED ORDER — ONDANSETRON HCL 4 MG/2ML IJ SOLN
INTRAMUSCULAR | Status: DC | PRN
  Administered 2024-02-16: 4 mg via INTRAVENOUS

## 2024-02-16 MED ORDER — ACETAMINOPHEN 10 MG/ML IV SOLN: 1000.0000 mg | Freq: Once | INTRAVENOUS | Status: DC | PRN

## 2024-02-16 MED ORDER — CEFAZOLIN SODIUM-DEXTROSE 2-4 GM/100ML-% IV SOLN
INTRAVENOUS | Status: AC
  Filled 2024-02-16: qty 100

## 2024-02-16 MED ORDER — ONDANSETRON HCL 4 MG PO TABS: 4.0000 mg | ORAL_TABLET | Freq: Four times a day (QID) | ORAL | Status: DC | PRN

## 2024-02-16 MED ORDER — BUPIVACAINE LIPOSOME 1.3 % IJ SUSP
INTRAMUSCULAR | Status: AC
  Filled 2024-02-16: qty 20

## 2024-02-16 MED ORDER — LACTATED RINGERS IV SOLN: INTRAVENOUS | Status: DC

## 2024-02-16 MED ORDER — OXYCODONE HCL 5 MG PO TABS
ORAL_TABLET | ORAL | Status: AC
  Filled 2024-02-16: qty 1

## 2024-02-16 MED ORDER — ONDANSETRON HCL 4 MG/2ML IJ SOLN
4.0000 mg | Freq: Four times a day (QID) | INTRAMUSCULAR | Status: DC | PRN
Start: 2024-02-16 — End: 2024-02-16

## 2024-02-16 MED ORDER — FENTANYL CITRATE (PF) 100 MCG/2ML IJ SOLN
INTRAMUSCULAR | Status: AC
  Filled 2024-02-16: qty 2

## 2024-02-16 MED ORDER — FENTANYL CITRATE (PF) 100 MCG/2ML IJ SOLN
25.0000 ug | INTRAMUSCULAR | Status: DC | PRN
  Administered 2024-02-16: 25 ug via INTRAVENOUS

## 2024-02-16 MED ORDER — LIDOCAINE HCL (CARDIAC) PF 100 MG/5ML IV SOSY
PREFILLED_SYRINGE | INTRAVENOUS | Status: DC | PRN
Start: 2024-02-16 — End: 2024-02-16
  Administered 2024-02-16: 80 mg via INTRAVENOUS

## 2024-02-16 MED ORDER — DEXMEDETOMIDINE HCL IN NACL 80 MCG/20ML IV SOLN
INTRAVENOUS | Status: DC | PRN
  Administered 2024-02-16: 12 ug via INTRAVENOUS

## 2024-02-16 MED ORDER — MIDAZOLAM HCL 2 MG/2ML IJ SOLN
INTRAMUSCULAR | Status: DC | PRN
  Administered 2024-02-16: 1 mg via INTRAVENOUS

## 2024-02-16 MED ORDER — OXYCODONE HCL 5 MG PO TABS
5.0000 mg | ORAL_TABLET | Freq: Once | ORAL | Status: AC | PRN
  Administered 2024-02-16: 5 mg via ORAL

## 2024-02-16 MED ORDER — FENTANYL CITRATE (PF) 100 MCG/2ML IJ SOLN
INTRAMUSCULAR | Status: DC | PRN
  Administered 2024-02-16 (×2): 25 ug via INTRAVENOUS

## 2024-02-16 MED ORDER — ACETAMINOPHEN 10 MG/ML IV SOLN
INTRAVENOUS | Status: AC
  Filled 2024-02-16: qty 100

## 2024-02-16 MED ORDER — 0.9 % SODIUM CHLORIDE (POUR BTL) OPTIME
TOPICAL | Status: DC | PRN
  Administered 2024-02-16: 1000 mL

## 2024-02-16 MED ORDER — LIDOCAINE HCL (PF) 1 % IJ SOLN
INTRAMUSCULAR | Status: DC | PRN
Start: 2024-02-16 — End: 2024-02-16
  Administered 2024-02-16: 10 mL

## 2024-02-16 MED ORDER — ACETAMINOPHEN 500 MG PO TABS: 500.0000 mg | ORAL_TABLET | ORAL | 0 refills | Status: AC | PRN

## 2024-02-16 MED ORDER — BUPIVACAINE HCL (PF) 0.5 % IJ SOLN
INTRAMUSCULAR | Status: AC
  Filled 2024-02-16: qty 30

## 2024-02-16 MED ORDER — FENTANYL CITRATE (PF) 100 MCG/2ML IJ SOLN: 50.0000 ug | INTRAMUSCULAR | Status: DC | PRN

## 2024-02-16 MED ORDER — OXYCODONE HCL 5 MG/5ML PO SOLN: 5.0000 mg | Freq: Once | ORAL | Status: DC | PRN

## 2024-02-16 MED ORDER — BUPIVACAINE HCL (PF) 0.5 % IJ SOLN
INTRAMUSCULAR | Status: DC | PRN
  Administered 2024-02-16: 10 mL

## 2024-02-16 MED ORDER — DEXAMETHASONE SODIUM PHOSPHATE 10 MG/ML IJ SOLN
INTRAMUSCULAR | Status: AC
  Filled 2024-02-16: qty 1

## 2024-02-16 MED ORDER — CHLORHEXIDINE GLUCONATE 0.12 % MT SOLN
OROMUCOSAL | Status: AC
  Filled 2024-02-16: qty 15

## 2024-02-16 MED ORDER — VANCOMYCIN HCL IN DEXTROSE 1-5 GM/200ML-% IV SOLN
INTRAVENOUS | Status: AC
  Filled 2024-02-16: qty 200

## 2024-02-16 MED ORDER — OXYCODONE HCL 5 MG PO TABS: 5.0000 mg | ORAL_TABLET | Freq: Once | ORAL | Status: DC | PRN

## 2024-02-16 SURGICAL SUPPLY — 47 items
BENZOIN TINCTURE PRP APPL 2/3 (GAUZE/BANDAGES/DRESSINGS) ×1 IMPLANT
BIT DRILL 100X1.3XAO CNCT (BIT) IMPLANT
BIT DRILL OVER SOLID 2.1X90 (DRILL) IMPLANT
BLADE SURG 15 STRL LF DISP TIS (BLADE) ×2 IMPLANT
BNDG COHESIVE 4X5 TAN STRL LF (GAUZE/BANDAGES/DRESSINGS) ×1 IMPLANT
BNDG ELASTIC 4X5.8 VLCR NS LF (GAUZE/BANDAGES/DRESSINGS) ×2 IMPLANT
BNDG ESMARCH 4X12 STRL LF (GAUZE/BANDAGES/DRESSINGS) ×1 IMPLANT
BNDG GAUZE DERMACEA FLUFF 4 (GAUZE/BANDAGES/DRESSINGS) ×1 IMPLANT
BNDG STRETCH GAUZE 3IN X12FT (GAUZE/BANDAGES/DRESSINGS) ×1 IMPLANT
CUFF TOURN SGL QUICK 12 (TOURNIQUET CUFF) IMPLANT
CUFF TOURN SGL QUICK 18X4 (TOURNIQUET CUFF) IMPLANT
DRAPE FLUOR MINI C-ARM 54X84 (DRAPES) ×1 IMPLANT
DURAPREP 26ML APPLICATOR (WOUND CARE) ×1 IMPLANT
ELECTRODE REM PT RTRN 9FT ADLT (ELECTROSURGICAL) ×1 IMPLANT
GAUZE SPONGE 4X4 12PLY STRL (GAUZE/BANDAGES/DRESSINGS) ×1 IMPLANT
GAUZE STRETCH 2X75IN STRL (MISCELLANEOUS) ×1 IMPLANT
GAUZE XEROFORM 1X8 LF (GAUZE/BANDAGES/DRESSINGS) ×1 IMPLANT
GLOVE BIO SURGEON STRL SZ7.5 (GLOVE) ×1 IMPLANT
GLOVE INDICATOR 8.0 STRL GRN (GLOVE) ×1 IMPLANT
GOWN STRL REUS W/ TWL XL LVL3 (GOWN DISPOSABLE) ×2 IMPLANT
MANIFOLD NEPTUNE II (INSTRUMENTS) ×2 IMPLANT
NDL FILTER BLUNT 18X1 1/2 (NEEDLE) ×1 IMPLANT
NDL HYPO 22X1.5 SAFETY MO (MISCELLANEOUS) ×1 IMPLANT
NDL HYPO 25X1 1.5 SAFETY (NEEDLE) ×2 IMPLANT
NEEDLE FILTER BLUNT 18X1 1/2 (NEEDLE) ×1 IMPLANT
NEEDLE HYPO 22X1.5 SAFETY MO (MISCELLANEOUS) ×1 IMPLANT
NEEDLE HYPO 25X1 1.5 SAFETY (NEEDLE) ×2 IMPLANT
NS IRRIG 500ML POUR BTL (IV SOLUTION) ×1 IMPLANT
PACK EXTREMITY ARMC (MISCELLANEOUS) ×1 IMPLANT
PADDING CAST BLEND 4X4 NS (MISCELLANEOUS) ×1 IMPLANT
PENCIL SMOKE EVACUATOR (MISCELLANEOUS) ×2 IMPLANT
PLATE LOCK GORILLA 6H MTP RT (Plate) IMPLANT
SCREW 2.0 X10 LOCKING (Screw) IMPLANT
SCREW LOCK 2.0X11 (Screw) IMPLANT
SCREW LOCKING 2.0X12 (Screw) IMPLANT
SCREW NLOCK 2.0X12 (Screw) IMPLANT
SPLINT CAST 1 STEP 4X30 (MISCELLANEOUS) ×1 IMPLANT
SPLINT PLASTER CAST FAST 5X30 (CAST SUPPLIES) ×1 IMPLANT
STOCKINETTE M/LG 89821 (MISCELLANEOUS) ×1 IMPLANT
STRAP SAFETY 5IN WIDE (MISCELLANEOUS) ×1 IMPLANT
STRIP CLOSURE SKIN 1/4X4 (GAUZE/BANDAGES/DRESSINGS) ×1 IMPLANT
SYR 10ML LL (SYRINGE) ×1 IMPLANT
SYR 50ML LL SCALE MARK (SYRINGE) ×1 IMPLANT
TRAP FLUID SMOKE EVACUATOR (MISCELLANEOUS) ×1 IMPLANT
WATER STERILE IRR 500ML POUR (IV SOLUTION) ×1 IMPLANT
WIRE OLIVE SMOOTH 1.3 (WIRE) IMPLANT
WIRE Z .062 C-WIRE SPADE TIP (WIRE) ×1 IMPLANT

## 2024-02-16 NOTE — H&P (Signed)
 HISTORY AND PHYSICAL INTERVAL NOTE:  02/16/2024  10:59 AM  Eric Clarke  has presented today for surgery, with the diagnosis of Closed displaced fracture of fifth metatarsal bone of right foot, initial encounter.  The various methods of treatment have been discussed with the patient.  No guarantees were given.  After consideration of risks, benefits and other options for treatment, the patient has consented to surgery.  I have reviewed the patients' chart and labs.     A history and physical examination was performed in my office.  The patient was reexamined.  There have been no changes to this history and physical examination.  Eric Clarke A

## 2024-02-16 NOTE — Anesthesia Procedure Notes (Signed)
 Procedure Name: LMA Insertion Date/Time: 02/16/2024 11:36 AM  Performed by: Dominica Krabbe, CRNAPre-anesthesia Checklist: Patient identified, Emergency Drugs available, Suction available, Patient being monitored and Timeout performed Patient Re-evaluated:Patient Re-evaluated prior to induction Oxygen Delivery Method: Circle system utilized Preoxygenation: Pre-oxygenation with 100% oxygen Induction Type: IV induction LMA: LMA inserted LMA Size: 4.0 Tube type: Oral Number of attempts: 1 Placement Confirmation: positive ETCO2 and breath sounds checked- equal and bilateral Tube secured with: Tape Dental Injury: Teeth and Oropharynx as per pre-operative assessment

## 2024-02-16 NOTE — Progress Notes (Signed)
 Patient was instructed regarding the use of crutches. Security guard ETTER Corporal ) and patient verbalized understanding of the education.

## 2024-02-16 NOTE — Transfer of Care (Signed)
 Immediate Anesthesia Transfer of Care Note  Patient: Eric Clarke  Procedure(s) Performed: OPEN REDUCTION INTERNAL FIXATION (ORIF) METATARSAL (TOE) FRACTURE (Right: Fifth Toe)  Patient Location: PACU  Anesthesia Type:General  Level of Consciousness: awake, alert , and oriented  Airway & Oxygen Therapy: Patient Spontanous Breathing and Patient connected to face mask oxygen  Post-op Assessment: Report given to RN and Post -op Vital signs reviewed and stable  Post vital signs: Reviewed and stable  Last Vitals:  Vitals Value Taken Time  BP 133/94 02/16/24 12:56  Temp    Pulse 80 02/16/24 13:00  Resp 14 02/16/24 13:00  SpO2 99 % 02/16/24 13:00  Vitals shown include unfiled device data.  Last Pain:  Vitals:   02/16/24 0948  TempSrc: Temporal  PainSc: 6       Patients Stated Pain Goal: 0 (02/16/24 0948)  Complications: No notable events documented.

## 2024-02-16 NOTE — Anesthesia Preprocedure Evaluation (Addendum)
 Anesthesia Evaluation  Patient identified by MRN, date of birth, ID band Patient awake    Reviewed: Allergy & Precautions, NPO status , Patient's Chart, lab work & pertinent test results  History of Anesthesia Complications Negative for: history of anesthetic complications  Airway Mallampati: II   Neck ROM: Full    Dental  (+) Missing, Chipped   Pulmonary Current Smoker (none in past two weeks)   Pulmonary exam normal breath sounds clear to auscultation       Cardiovascular negative cardio ROS Normal cardiovascular exam Rhythm:Regular Rate:Normal  ECG 11/19/23: SR  Echo 10/31/22:    1. The left ventricle is normal in size with upper normal wall thickness.    2. The left ventricular systolic function is normal, LVEF is visually estimated at 55%.    3. The right ventricle is normal in size, with normal systolic function.    4. There is mild mitral valve regurgitation.    5. There are no obvious valvular vegetations.    Neuro/Psych Methamphetamine use disorder, none in past two weeks    GI/Hepatic negative GI ROS, Neg liver ROS,,,  Endo/Other  negative endocrine ROS    Renal/GU negative Renal ROS     Musculoskeletal   Abdominal   Peds  Hematology negative hematology ROS (+)   Anesthesia Other Findings   Reproductive/Obstetrics                              Anesthesia Physical Anesthesia Plan  ASA: 2  Anesthesia Plan: General   Post-op Pain Management:    Induction: Intravenous  PONV Risk Score and Plan: 1 and Ondansetron , Dexamethasone  and Treatment may vary due to age or medical condition  Airway Management Planned: LMA  Additional Equipment:   Intra-op Plan:   Post-operative Plan: Extubation in OR  Informed Consent: I have reviewed the patients History and Physical, chart, labs and discussed the procedure including the risks, benefits and alternatives for the proposed  anesthesia with the patient or authorized representative who has indicated his/her understanding and acceptance.     Dental advisory given  Plan Discussed with: CRNA  Anesthesia Plan Comments: (Patient consented for risks of anesthesia including but not limited to:  - adverse reactions to medications - damage to eyes, teeth, lips or other oral mucosa - nerve damage due to positioning  - sore throat or hoarseness - damage to heart, brain, nerves, lungs, other parts of body or loss of life  Informed patient about role of CRNA in peri- and intra-operative care.  Patient voiced understanding.)         Anesthesia Quick Evaluation

## 2024-02-16 NOTE — Discharge Instructions (Signed)
 [facility]  POST OPERATIVE INSTRUCTIONS FOR DR. ASHLEY AND DR. BAKER [facility]IN[facility] PODIATRY DEPARTMENT   Take your medication as prescribed.  Pain medication should be taken only as needed.  I have recommended a combination of ibuprofen  and Tylenol  for postoperative pain.  Two 200 mg ibuprofen  can be taken with one 500 mg Tylenol  every 4 hours as needed for pain.  Keep the dressing clean, dry and intact.  Keep your foot elevated above the heart level for the first 48 hours.  We have instructed you to be non-weight bearing.  Always wear your post-op shoe when walking.  Always use your crutches if you are to be non-weight bearing.  Do not take a shower. Baths are permissible as long as the foot is kept out of the water.   Every hour you are awake:  Bend your knee 15 times. Flex foot 15 times Massage calf 15 times  Call [facility] [phone]) if any of the following problems occur: You develop a temperature or fever. The bandage becomes saturated with blood. Medication does not stop your pain. Injury of the foot occurs. Any symptoms of infection including redness, odor, or red streaks running from wound.

## 2024-02-16 NOTE — Anesthesia Postprocedure Evaluation (Signed)
 Anesthesia Post Note  Patient: Eric Clarke  Procedure(s) Performed: OPEN REDUCTION INTERNAL FIXATION (ORIF) METATARSAL (TOE) FRACTURE (Right: Fifth Toe)  Patient location during evaluation: PACU Anesthesia Type: General Level of consciousness: awake and alert, oriented and patient cooperative Pain management: pain level controlled Vital Signs Assessment: post-procedure vital signs reviewed and stable Respiratory status: spontaneous breathing, nonlabored ventilation and respiratory function stable Cardiovascular status: blood pressure returned to baseline and stable Postop Assessment: adequate PO intake Anesthetic complications: no   No notable events documented.   Last Vitals:  Vitals:   02/16/24 1330 02/16/24 1439  BP: (!) 128/91 123/83  Pulse: 68 65  Resp: 20 20  Temp: (!) 36.3 C (!) 36.3 C  SpO2: 100% 100%    Last Pain:  Vitals:   02/16/24 1439  TempSrc: Temporal  PainSc: 4                  Myan Suit

## 2024-02-16 NOTE — Op Note (Signed)
 Operative note   Surgeon:Krishiv Sandler [employer] logistics/support/administrative officer: None    Preop diagnosis: Right fifth metatarsal fracture    Postop diagnosis: Same    Procedure: 1.  Open reduction with internal fixation right fifth metatarsal fracture 2.  Intraoperative fluoroscopy use without assistance of radiologist    EBL: Minimal    Anesthesia:local and general.  Local consists of a one-to-one mixture of 0.5% plain bupivacaine  and Exparel  long-acting anesthetic.  A total of 20 cc was used    Hemostasis: Ankle tourniquet inflated to 200 mmHg for approximately 45 minutes    Specimen: None    Complications: None    Operative indications:Eric Clarke is an 33 y.o. that presents today for surgical intervention.  The risks/benefits/alternatives/complications have been discussed and consent has been given.    Procedure:  Patient was brought into the OR and placed on the operating table in thesupine position. After anesthesia was obtained theright lower extremity was prepped and draped in usual sterile fashion.  Tissue edge directed to the dorsal lateral aspect of the right fifth metatarsal where a dorsal incision was performed.  Sharp and blunt dissection carried down to the fracture itself.  The fracture was then exposed.  There was noted to be some healing periosteal bone formation.  This was read up.  The distal fragment was distracted distally and stabilized with bone reduction forceps.  A 2.0 millimeter screw was placed through the fracture fragment in the lag fashion for stability.  At this time a small L plate from the [facility] set was placed with 2.0 millimeter screws.  Good stability was noted in all planes with anatomic reduction noted.  The wound was flushed with copious amounts of irrigation.  Closure was performed with a 3-0 Vicryl and 4-0 Vicryl the deeper soft tissue in a 3-0 nylon for the skin.  A bulky sterile dressing was applied.    Patient tolerated the procedure and anesthesia well.  Was  transported from the OR to the PACU with all vital signs stable and vascular status intact. To be discharged per routine protocol.  Will follow up in approximately 1 week in the outpatient clinic.

## 2024-02-19 ENCOUNTER — Encounter: Payer: Self-pay | Admitting: Podiatry

## 2024-02-20 ENCOUNTER — Encounter: Payer: Self-pay | Admitting: Podiatry
